# Patient Record
Sex: Female | Born: 1935 | Race: White | Hispanic: No | Smoking: Never smoker
Health system: Southern US, Community
[De-identification: ages and names within clinical notes are randomized; demographics above are authoritative.]

## PROBLEM LIST (undated history)

## (undated) DIAGNOSIS — E785 Hyperlipidemia, unspecified: Secondary | ICD-10-CM

## (undated) DIAGNOSIS — I350 Nonrheumatic aortic (valve) stenosis: Secondary | ICD-10-CM

## (undated) DIAGNOSIS — I2510 Atherosclerotic heart disease of native coronary artery without angina pectoris: Secondary | ICD-10-CM

## (undated) DIAGNOSIS — R001 Bradycardia, unspecified: Secondary | ICD-10-CM

## (undated) DIAGNOSIS — H409 Unspecified glaucoma: Secondary | ICD-10-CM

## (undated) DIAGNOSIS — I10 Essential (primary) hypertension: Secondary | ICD-10-CM

## (undated) HISTORY — DX: Nonrheumatic aortic (valve) stenosis: I35.0

## (undated) HISTORY — DX: Hyperlipidemia, unspecified: E78.5

## (undated) HISTORY — PX: CHOLECYSTECTOMY: SHX55

## (undated) HISTORY — DX: Unspecified glaucoma: H40.9

## (undated) HISTORY — DX: Bradycardia, unspecified: R00.1

## (undated) HISTORY — DX: Atherosclerotic heart disease of native coronary artery without angina pectoris: I25.10

## (undated) HISTORY — PX: ABDOMINAL HYSTERECTOMY: SHX81

## (undated) HISTORY — DX: Essential (primary) hypertension: I10

---

## 1993-03-21 HISTORY — PX: PACEMAKER INSERTION: SHX728

## 1999-08-10 ENCOUNTER — Encounter (INDEPENDENT_AMBULATORY_CARE_PROVIDER_SITE_OTHER): Payer: Self-pay | Admitting: Specialist

## 1999-08-10 ENCOUNTER — Encounter: Payer: Self-pay | Admitting: *Deleted

## 1999-08-10 ENCOUNTER — Encounter: Admission: RE | Admit: 1999-08-10 | Discharge: 1999-08-10 | Payer: Self-pay | Admitting: *Deleted

## 1999-08-10 ENCOUNTER — Other Ambulatory Visit: Admission: RE | Admit: 1999-08-10 | Discharge: 1999-08-10 | Payer: Self-pay | Admitting: *Deleted

## 2002-04-15 ENCOUNTER — Inpatient Hospital Stay (HOSPITAL_COMMUNITY): Admission: EM | Admit: 2002-04-15 | Discharge: 2002-04-18 | Payer: Self-pay | Admitting: Cardiology

## 2002-04-15 ENCOUNTER — Encounter: Payer: Self-pay | Admitting: Cardiology

## 2002-04-16 ENCOUNTER — Encounter: Payer: Self-pay | Admitting: Cardiology

## 2004-02-16 ENCOUNTER — Ambulatory Visit: Payer: Self-pay | Admitting: Cardiology

## 2004-02-19 ENCOUNTER — Ambulatory Visit: Payer: Self-pay | Admitting: Cardiology

## 2004-04-12 ENCOUNTER — Ambulatory Visit: Payer: Self-pay | Admitting: Cardiology

## 2004-12-08 ENCOUNTER — Ambulatory Visit: Payer: Self-pay | Admitting: Internal Medicine

## 2005-03-25 ENCOUNTER — Ambulatory Visit: Payer: Self-pay | Admitting: Cardiology

## 2005-03-30 ENCOUNTER — Ambulatory Visit: Payer: Self-pay | Admitting: Cardiology

## 2005-12-08 ENCOUNTER — Ambulatory Visit: Payer: Self-pay | Admitting: Cardiology

## 2006-03-31 ENCOUNTER — Ambulatory Visit: Payer: Self-pay | Admitting: Cardiology

## 2006-03-31 ENCOUNTER — Encounter: Payer: Self-pay | Admitting: Physician Assistant

## 2006-04-12 ENCOUNTER — Ambulatory Visit: Payer: Self-pay | Admitting: Cardiology

## 2006-04-12 ENCOUNTER — Encounter: Payer: Self-pay | Admitting: Physician Assistant

## 2006-05-10 ENCOUNTER — Ambulatory Visit: Payer: Self-pay | Admitting: Cardiology

## 2006-05-25 ENCOUNTER — Ambulatory Visit: Payer: Self-pay | Admitting: *Deleted

## 2006-06-22 ENCOUNTER — Ambulatory Visit: Payer: Self-pay | Admitting: *Deleted

## 2006-07-10 ENCOUNTER — Encounter (INDEPENDENT_AMBULATORY_CARE_PROVIDER_SITE_OTHER): Payer: Self-pay | Admitting: Specialist

## 2006-07-10 ENCOUNTER — Ambulatory Visit: Payer: Self-pay | Admitting: *Deleted

## 2006-07-10 ENCOUNTER — Inpatient Hospital Stay (HOSPITAL_COMMUNITY): Admission: RE | Admit: 2006-07-10 | Discharge: 2006-07-11 | Payer: Self-pay | Admitting: *Deleted

## 2006-08-03 ENCOUNTER — Ambulatory Visit: Payer: Self-pay | Admitting: *Deleted

## 2006-12-14 ENCOUNTER — Ambulatory Visit: Payer: Self-pay | Admitting: Cardiology

## 2007-02-01 ENCOUNTER — Ambulatory Visit: Payer: Self-pay | Admitting: *Deleted

## 2007-04-05 ENCOUNTER — Encounter (HOSPITAL_COMMUNITY): Admission: RE | Admit: 2007-04-05 | Discharge: 2007-05-05 | Payer: Self-pay | Admitting: Endocrinology

## 2007-04-05 ENCOUNTER — Encounter: Payer: Self-pay | Admitting: Endocrinology

## 2007-05-08 ENCOUNTER — Ambulatory Visit: Payer: Self-pay | Admitting: Cardiology

## 2007-05-08 ENCOUNTER — Encounter: Payer: Self-pay | Admitting: Physician Assistant

## 2007-05-11 ENCOUNTER — Encounter: Payer: Self-pay | Admitting: Cardiology

## 2007-05-11 ENCOUNTER — Ambulatory Visit: Payer: Self-pay | Admitting: Cardiology

## 2007-12-14 ENCOUNTER — Ambulatory Visit: Payer: Self-pay | Admitting: Internal Medicine

## 2008-01-01 ENCOUNTER — Ambulatory Visit: Payer: Self-pay | Admitting: Cardiology

## 2008-01-17 ENCOUNTER — Ambulatory Visit: Payer: Self-pay | Admitting: *Deleted

## 2008-05-13 ENCOUNTER — Encounter: Payer: Self-pay | Admitting: Endocrinology

## 2008-06-26 ENCOUNTER — Encounter: Payer: Self-pay | Admitting: Internal Medicine

## 2008-07-29 DIAGNOSIS — I350 Nonrheumatic aortic (valve) stenosis: Secondary | ICD-10-CM

## 2008-07-29 DIAGNOSIS — I2510 Atherosclerotic heart disease of native coronary artery without angina pectoris: Secondary | ICD-10-CM | POA: Insufficient documentation

## 2008-08-14 ENCOUNTER — Ambulatory Visit: Payer: Self-pay | Admitting: Internal Medicine

## 2008-08-19 ENCOUNTER — Telehealth: Payer: Self-pay | Admitting: Internal Medicine

## 2008-08-19 ENCOUNTER — Encounter: Payer: Self-pay | Admitting: Internal Medicine

## 2008-08-19 DIAGNOSIS — I495 Sick sinus syndrome: Secondary | ICD-10-CM

## 2008-08-25 ENCOUNTER — Telehealth: Payer: Self-pay | Admitting: Internal Medicine

## 2008-08-28 ENCOUNTER — Telehealth: Payer: Self-pay | Admitting: Internal Medicine

## 2008-09-01 ENCOUNTER — Ambulatory Visit (HOSPITAL_COMMUNITY): Admission: RE | Admit: 2008-09-01 | Discharge: 2008-09-01 | Payer: Self-pay | Admitting: Internal Medicine

## 2008-09-01 ENCOUNTER — Ambulatory Visit: Payer: Self-pay | Admitting: Internal Medicine

## 2008-09-08 ENCOUNTER — Encounter: Payer: Self-pay | Admitting: Internal Medicine

## 2008-09-12 ENCOUNTER — Ambulatory Visit: Payer: Self-pay | Admitting: Internal Medicine

## 2008-10-15 ENCOUNTER — Encounter: Payer: Self-pay | Admitting: Internal Medicine

## 2008-10-24 ENCOUNTER — Telehealth (INDEPENDENT_AMBULATORY_CARE_PROVIDER_SITE_OTHER): Payer: Self-pay | Admitting: *Deleted

## 2008-11-05 ENCOUNTER — Encounter: Payer: Self-pay | Admitting: Internal Medicine

## 2008-11-05 ENCOUNTER — Telehealth: Payer: Self-pay | Admitting: Internal Medicine

## 2008-11-17 ENCOUNTER — Telehealth: Payer: Self-pay | Admitting: Internal Medicine

## 2008-12-19 ENCOUNTER — Ambulatory Visit: Payer: Self-pay | Admitting: Endocrinology

## 2008-12-19 ENCOUNTER — Encounter (INDEPENDENT_AMBULATORY_CARE_PROVIDER_SITE_OTHER): Payer: Self-pay | Admitting: *Deleted

## 2008-12-19 DIAGNOSIS — E210 Primary hyperparathyroidism: Secondary | ICD-10-CM

## 2008-12-19 DIAGNOSIS — E042 Nontoxic multinodular goiter: Secondary | ICD-10-CM | POA: Insufficient documentation

## 2008-12-19 LAB — CONVERTED CEMR LAB: TSH: 1.37 microintl units/mL (ref 0.35–5.50)

## 2008-12-20 ENCOUNTER — Encounter: Payer: Self-pay | Admitting: Endocrinology

## 2008-12-23 LAB — CONVERTED CEMR LAB
Calcium, Total (PTH): 11.5 mg/dL — ABNORMAL HIGH (ref 8.4–10.5)
PTH: 263.2 pg/mL — ABNORMAL HIGH (ref 14.0–72.0)

## 2008-12-24 ENCOUNTER — Encounter: Payer: Self-pay | Admitting: Endocrinology

## 2008-12-29 ENCOUNTER — Ambulatory Visit: Payer: Self-pay | Admitting: Endocrinology

## 2008-12-29 DIAGNOSIS — M810 Age-related osteoporosis without current pathological fracture: Secondary | ICD-10-CM | POA: Insufficient documentation

## 2009-01-01 LAB — CONVERTED CEMR LAB
Calcium, Total (PTH): 11.9 mg/dL — ABNORMAL HIGH (ref 8.4–10.5)
PTH: 205 pg/mL — ABNORMAL HIGH (ref 14.0–72.0)

## 2009-01-02 ENCOUNTER — Telehealth (INDEPENDENT_AMBULATORY_CARE_PROVIDER_SITE_OTHER): Payer: Self-pay | Admitting: *Deleted

## 2009-03-12 ENCOUNTER — Telehealth (INDEPENDENT_AMBULATORY_CARE_PROVIDER_SITE_OTHER): Payer: Self-pay | Admitting: *Deleted

## 2009-05-01 ENCOUNTER — Ambulatory Visit: Payer: Self-pay | Admitting: Internal Medicine

## 2009-08-10 ENCOUNTER — Ambulatory Visit: Payer: Self-pay | Admitting: Endocrinology

## 2009-08-10 LAB — CONVERTED CEMR LAB
Calcium, Total (PTH): 11.2 mg/dL — ABNORMAL HIGH (ref 8.4–10.5)
TSH: 1.75 microintl units/mL (ref 0.35–5.50)

## 2009-08-13 ENCOUNTER — Encounter: Payer: Self-pay | Admitting: Endocrinology

## 2009-10-01 ENCOUNTER — Ambulatory Visit: Payer: Self-pay | Admitting: Endocrinology

## 2009-10-07 ENCOUNTER — Encounter: Payer: Self-pay | Admitting: Endocrinology

## 2009-11-20 ENCOUNTER — Ambulatory Visit: Payer: Self-pay | Admitting: Internal Medicine

## 2010-04-11 ENCOUNTER — Encounter: Payer: Self-pay | Admitting: Endocrinology

## 2010-04-20 NOTE — Cardiovascular Report (Signed)
Summary: Cardiac Catheterization  Cardiac Catheterization   Imported By: Dorise Hiss 11/17/2009 09:12:52  _____________________________________________________________________  External Attachment:    Type:   Image     Comment:   External Document

## 2010-04-20 NOTE — Cardiovascular Report (Signed)
Summary: Office Visit   Office Visit   Imported By: Roderic Ovens 12/01/2009 12:26:02  _____________________________________________________________________  External Attachment:    Type:   Image     Comment:   External Document

## 2010-04-20 NOTE — Cardiovascular Report (Signed)
Summary: Office Visit   Office Visit   Imported By: Roderic Ovens 05/12/2009 11:41:07  _____________________________________________________________________  External Attachment:    Type:   Image     Comment:   External Document

## 2010-04-20 NOTE — Procedures (Signed)
Summary: new pacer maker *st jude*   Current Medications (verified): 1)  Vitamin D (Ergocalciferol) 50000 Unit Caps (Ergocalciferol) .Marland Kitchen.. 1 Q Week 2)  Dorzolamide Hcl 2 % Soln (Dorzolamide Hcl) 3)  Pilopine Hs 4 % Gel (Pilocarpine Hcl) 4)  Travatan Z 0.004 % Soln (Travoprost) 5)  Simvastatin 80 Mg Tabs (Simvastatin) 6)  Metoprolol Succinate 25 Mg Xr24h-Tab (Metoprolol Succinate) 7)  Altace 2.5 Mg Caps (Ramipril) 8)  Cycloben Zaprine Hcl 10 Mg Tab 9)  Citonin Salmon 200 Iu  Allergies (verified): No Known Drug Allergies  PPM Specifications Following MD:  Sherryl Manges, MD     PPM Vendor:  St Jude     PPM Model Number:  [id number]     PPM Serial Number:  [id number] DOI:  02/09/1994     PPM Implanting MD:  Sherryl Manges, MD  Lead 1    Location: RV     DOI: 02/09/1994     Model #: [id number]     Serial #:     Status: active  Magnet Response Rate:  BOL 100 ERI 85  Indications:  sinus arrest  Explantation Comments:  09/01/2008 Nova 282 explanted  PPM Follow Up Remote Check?  No Battery Voltage:  3.01 V     Battery Est. Longevity:  13 YEARS     Pacer Dependent:  No     Right Ventricle  Amplitude: 12 mV, Impedance: 580 ohms, Threshold: 0.75 V at 0.5 msec  Episodes Ventricular Pacing:  2.6%  Parameters Mode:  VVI     Lower Rate Limit:  50     Next Cardiology Appt Due:  10/19/2009 Tech Comments:  Pacer check today.  53 noise reversion episodes.  All short (<30 seconds).  Patient's lead was placed in 1995, unipolar, no option for bipolar sensing.  Pt not pacer dependent.  Noise reversion mode is VOO.  Plan ROV with Dr Johney Frame in 6 months. Gypsy Balsam RN BSN  May 01, 2009 9:44 AM   Appended Document: new pacer maker *st jude* Noise reversion noted upon prior interrogation by Dr Graciela Husbands.   We will follow.

## 2010-04-20 NOTE — Procedures (Signed)
Summary: pacemakerck/rm   Current Medications (verified): 1)  Dorzolamide Hcl 2 % Soln (Dorzolamide Hcl) 2)  Pilopine Hs 4 % Gel (Pilocarpine Hcl) 3)  Travatan Z 0.004 % Soln (Travoprost) 4)  Simvastatin 80 Mg Tabs (Simvastatin) .... Take 1/2 Tablet By Mouth Once Daily 5)  Metoprolol Succinate 25 Mg Xr24h-Tab (Metoprolol Succinate) .... Take 1/2 Tablet By Mouth Once Daily 6)  Altace 2.5 Mg Caps (Ramipril) .... Take 1 Capsule By Mouth Once Daily 7)  Cyclobenzaprine Hcl 10 Mg Tabs (Cyclobenzaprine Hcl) .Marland Kitchen.. 1 Tab Three Times A Day As Needed For Muscle Spasms  Allergies (verified): No Known Drug Allergies  PPM Specifications Following MD:  Sherryl Manges, MD     PPM Vendor:  St Jude     PPM Model Number:  [id number]     PPM Serial Number:  [id number] PPM DOI:  02/09/1994     PPM Implanting MD:  Sherryl Manges, MD  Lead 1    Location: RV     DOI: 02/09/1994     Model #: 431-07     Serial #:     Status: active  Magnet Response Rate:  BOL 100 ERI 85  Indications:  sinus arrest  Explantation Comments:  09/01/2008 Nova 282 explanted  PPM Follow Up Battery Voltage:  2.99 V     Battery Est. Longevity:  12.6 yrs     Pacer Dependent:  No     Right Ventricle  Amplitude: 11.7 mV, Impedance: 580 ohms, Threshold: 075 V at 0.4 msec  Episodes MS Episodes:  0     Ventricular Pacing:  1.8%  Parameters Mode:  VVI     Lower Rate Limit:  50     Next Cardiology Appt Due:  05/20/2010 Tech Comments:  50 NOISE REVERSION EPISODES (PREV CHECK WAS NOISE EPISODES AS WELL).  NORMAL DEVICE FUNCTION.  NO CHANGES MADE. ROV IN 6 MTHS W/JA. Vella Kohler  November 20, 2009 11:46 AM MD Comments:  noise detected on ventricular lead.  Lead appears to be an intermedics lead.  Impedance/ R wave and threshold appear stable.  Only 1.8% V paced.  Will follow and consider adjusting sensitivity upon return.

## 2010-04-20 NOTE — Assessment & Plan Note (Signed)
Summary: FU---STC   Vital Signs:  Patient profile:   77 year old female Height:      63 inches (160.02 cm) Weight:      127.50 pounds (57.95 kg) O2 Sat:      97 % on Room air Temp:     98.6 degrees F (37.00 degrees C) oral Pulse rate:   72 / minute BP sitting:   118 / 80  (left arm) Cuff size:   regular  Vitals Entered By: Josph Macho RMA (Aug 10, 2009 11:26 AM)  O2 Flow:  Room air CC: Follow-up visit/ pt states she is no longer taking Vitamin D and not sure about Cycloben Zaprine/ CF   Referring Provider:  daniel  CC:  Follow-up visit/ pt states she is no longer taking Vitamin D and not sure about Cycloben Zaprine/ CF.  History of Present Illness: the status of at least 3 ongoing medical problems is addressed today: osteoporosis:  she is currently on no rx.  no falls. primary hyperparathyroidism:  she reports cramps, especially at the neck. goiter:  pt says she has intermittent solid dysphagia.  Current Medications (verified): 1)  Vitamin D (Ergocalciferol) 50000 Unit Caps (Ergocalciferol) .Marland Kitchen.. 1 Q Week 2)  Dorzolamide Hcl 2 % Soln (Dorzolamide Hcl) 3)  Pilopine Hs 4 % Gel (Pilocarpine Hcl) 4)  Travatan Z 0.004 % Soln (Travoprost) 5)  Simvastatin 80 Mg Tabs (Simvastatin) 6)  Metoprolol Succinate 25 Mg Xr24h-Tab (Metoprolol Succinate) 7)  Altace 2.5 Mg Caps (Ramipril) 8)  Cycloben Zaprine Hcl 10 Mg Tab 9)  Citonin Salmon 200 Iu  Allergies (verified): No Known Drug Allergies  Past History:  Past Medical History: Last updated: 07/25/2008 Intermedics Nova III pacemaker, Model [id number], implanted on February 09, 1994 CAD Hyperlipidemia Hypertension Aortic Insufficiency Aortic Stenosis Biliary pancreatitis 1995 Glaucoma  Review of Systems  The patient denies syncope.         denies cramps.    Physical Exam  General:  normal appearance.   Neck:  i do not apreciate a goiter Msk:  gait is slow but steady Additional Exam:    Parathyroid Hormone  [H]  203.0  pg/mL                 14.0-72.0   Calcium              [H]  11.2 mg/dL FastTSH                   1.75 uIU/mL      Impression & Recommendations:  Problem # 1:  OSTEOPOROSIS (ICD-733.00) prob exac by #1 can be followed for now  Problem # 2:  PRIMARY HYPERPARATHYROIDISM (ICD-252.01) Assessment: Unchanged can be followed for now  Problem # 3:  GOITER, MULTINODULAR (ICD-241.1) Assessment: Unchanged  Problem # 4:  dysphagia not related to #3  Medications Added to Medication List This Visit: 1)  Cyclobenzaprine Hcl 10 Mg Tabs (Cyclobenzaprine hcl) .Marland Kitchen.. 1 tab three times a day as needed for muscle spasms  Other Orders: T-Parathyroid Hormone, Intact w/ Calcium (09811-91478) Radiology Referral (Radiology) Radiology Referral (Radiology) TLB-TSH (Thyroid Stimulating Hormone) (84443-TSH) Est. Patient Level IV (29562)  Patient Instructions: 1)  blood tests are being ordered for you today.  please call [phone] to hear your test results. 2)  also, i have requested a repeat thyroid ultrasound to be done at [facility].  you will be called with a day and time for an appointment.  a few days after this, also  please call [phone] to hear your results. 3)  also, i have requested a repeat osteoporosis x ray.  you will be called with a day and time for an appointment. 4)  it is unlikely that the difficulty swallowing is due to your enlarged thyroid.  you should have this checked as a separate problem.   5)  Please schedule a follow-up appointment in 6 months. 6)  (update: i left message on phone-tree:  rx as we discussed)

## 2010-04-20 NOTE — Assessment & Plan Note (Signed)
Summary: PER DAHLIA SCHED--FU---STC   Vital Signs:  Patient profile:   77 year old female Height:      63 inches (160.02 cm) Weight:      154.25 pounds (70.11 kg) BMI:     27.42 O2 Sat:      96 % on Room air Temp:     98.1 degrees F (36.72 degrees C) oral Pulse rate:   74 / minute BP sitting:   138 / 80  (left arm) Cuff size:   regular  Vitals Entered By: Brenton Grills MA (October 01, 2009 1:08 PM)  O2 Flow:  Room air CC: F/U appt/aj   Referring Provider:  daniel  CC:  F/U appt/aj.  History of Present Illness: (pt asks about thyroid ultrasound scheduling)--no visit  Current Medications (verified): 1)  Dorzolamide Hcl 2 % Soln (Dorzolamide Hcl) 2)  Pilopine Hs 4 % Gel (Pilocarpine Hcl) 3)  Travatan Z 0.004 % Soln (Travoprost) 4)  Simvastatin 80 Mg Tabs (Simvastatin) 5)  Metoprolol Succinate 25 Mg Xr24h-Tab (Metoprolol Succinate) 6)  Altace 2.5 Mg Caps (Ramipril) 7)  Cyclobenzaprine Hcl 10 Mg Tabs (Cyclobenzaprine Hcl) .Marland Kitchen.. 1 Tab Three Times A Day As Needed For Muscle Spasms  Allergies (verified): No Known Drug Allergies   Other Orders: No Charge Patient Arrived (NCPA0) (NCPA0)

## 2010-05-26 ENCOUNTER — Other Ambulatory Visit (INDEPENDENT_AMBULATORY_CARE_PROVIDER_SITE_OTHER): Payer: Medicare Other

## 2010-05-26 ENCOUNTER — Encounter (INDEPENDENT_AMBULATORY_CARE_PROVIDER_SITE_OTHER): Payer: Medicare Other | Admitting: Vascular Surgery

## 2010-05-26 DIAGNOSIS — I6529 Occlusion and stenosis of unspecified carotid artery: Secondary | ICD-10-CM

## 2010-05-27 ENCOUNTER — Encounter (INDEPENDENT_AMBULATORY_CARE_PROVIDER_SITE_OTHER): Payer: Self-pay | Admitting: *Deleted

## 2010-05-27 NOTE — Assessment & Plan Note (Signed)
OFFICE VISIT  Jodi, Dyer DOB:  1924-04-02                                       05/26/2010 BJYNW#:[id number]  I saw Jodi Dyer in the office today for follow-up of her carotid disease. This was a prior patient of Dr. [name].  She had undergone a right carotid endarterectomy in April 2080.  She has been routine carotid duplex scan in our office but has not been seen by [place] since May of 2008. She denies any history of stroke, TIAs, expressive or receptive aphasia, or amaurosis fugax.  SOCIAL HISTORY:  She has 5 children.  She is widowed.  She is retired. She does not smoke cigarettes.  REVIEW OF SYSTEMS:  CARDIOVASCULAR:  She had no chest pain, chest pressure, palpitations or arrhythmias.  She had no claudication, rest pain or nonhealing ulcers.  She has had no history of DVT or phlebitis. PULMONARY:  She had no productive cough, bronchitis, asthma or wheezing.  PHYSICAL EXAMINATION:  This is a pleasant 78 year old woman who appears her stated age.  Blood pressure 146/73, heart rate is 69, saturation 96%.  Lungs:  Clear  bilaterally to auscultation.  Cardiovascular exam: I do not detect any carotid bruits.  She has a regular rate and rhythm with a systolic murmur.  She has palpable femoral pulses with well- perfused feet.  No ischemic ulcers.  She has no significant extremity swelling.  Abdomen:  Soft, nontender with normal pitched bowel sounds. Neurologic exam:  She has no focal weakness or paresthesias.  I independently interpreted the carotid duplex scan which shows a widely patent right carotid endarterectomy site without evidence of restenosis and no significant stenosis on the left side.  Both vertebral arteries are patent with normally directed flow.  I have ordered a follow-up duplex scan in 1 year.  She will be followed on our protocol.  Will see her back if she develops any new neurologic symptoms or her duplex shows any progression of any  significant carotid disease.    Di Kindle. Edilia Bo, M.D. Electronically Signed  CSD/MEDQ  D:  05/26/2010  T:  05/27/2010  Job:  [id number]

## 2010-06-01 NOTE — Letter (Signed)
Summary: Device-Delinquent Check  [facility], Main Office  [address]   [place], [place] [address]   Phone: [phone]  Fax: [phone]     May 27, 2010 MRN: [id number]   Jodi Dyer [address] [place], [place]  [address]   Dear [name],  According to our records, you have not had your implanted device checked in the recommended period of time.  We are unable to determine appropriate device function without checking your device on a regular basis.  Please call our office to schedule an appointment as soon as possible.  If you are having your device checked by another physician, please call us so that we may update our records.  Thank you,  Letta Moynahan, EMT  May 27, 2010 3:44 PM  [facility]

## 2010-06-02 NOTE — Procedures (Unsigned)
CAROTID DUPLEX EXAM  INDICATION:  Follow up right CEA/left carotid disease.  HISTORY: Diabetes:  No. Cardiac:  Yes. Hypertension:  Yes. Smoking:  No. Previous Surgery:  Right CEA, 07/10/2006. CV History:  No. Amaurosis Fugax No, Paresthesias No, Hemiparesis No.                                      RIGHT             LEFT Brachial systolic pressure:         152               140 Brachial Doppler waveforms:         WNL               WNL Vertebral direction of flow:        Antegrade         Antegrade DUPLEX VELOCITIES (cm/sec) CCA peak systolic                   89                92 ECA peak systolic                   139               91 ICA peak systolic                   95                51 ICA end diastolic                   10                15 PLAQUE MORPHOLOGY:                  Heterogenous      Heterogenous PLAQUE AMOUNT:                      Mild              Mild PLAQUE LOCATION:                    ICA               ICA  IMPRESSION: 1. Widely patent right carotid endarterectomy without evidence of     restenosis or hyperplasia. 2. 1% to 39% left internal carotid artery plaquing. 3. Antegrade vertebral arteries bilaterally. 4. No significant change since prior examination of 01/17/2008.  ___________________________________________ Di Kindle. Edilia Bo, M.D.  LT/MEDQ  D:  05/26/2010  T:  05/26/2010  Job:  [id number]

## 2010-06-28 LAB — BASIC METABOLIC PANEL
CO2: 26 mEq/L (ref 19–32)
Chloride: 112 mEq/L (ref 96–112)
GFR calc Af Amer: >60 mL/min (ref >60)
Potassium: 4 mEq/L (ref 3.5–5.1)
Sodium: 143 mEq/L (ref 135–145)

## 2010-06-28 LAB — CBC
HCT: 42.3 % (ref 36.0–46.0)
Hemoglobin: 14.7 g/dL (ref 12.0–15.0)
MCHC: 34.7 g/dL (ref 30.0–36.0)
MCV: 91.1 fL (ref 78.0–100.0)
RBC: 4.64 MIL/uL (ref 3.87–5.11)

## 2010-06-28 LAB — PROTIME-INR: INR: 1 (ref 0.00–1.49)

## 2010-07-23 ENCOUNTER — Encounter: Payer: Self-pay | Admitting: Cardiology

## 2010-07-23 ENCOUNTER — Ambulatory Visit (INDEPENDENT_AMBULATORY_CARE_PROVIDER_SITE_OTHER): Payer: Medicare Other | Admitting: Internal Medicine

## 2010-07-23 ENCOUNTER — Encounter: Payer: Self-pay | Admitting: Internal Medicine

## 2010-07-23 DIAGNOSIS — I2510 Atherosclerotic heart disease of native coronary artery without angina pectoris: Secondary | ICD-10-CM

## 2010-07-23 DIAGNOSIS — I4891 Unspecified atrial fibrillation: Secondary | ICD-10-CM

## 2010-07-23 DIAGNOSIS — I498 Other specified cardiac arrhythmias: Secondary | ICD-10-CM

## 2010-07-23 NOTE — Assessment & Plan Note (Signed)
Normal pacemaker function, though she continues to have noise on her ventricular lead.  As the impedance and threshold are stable, we will continue to monitor this.  We could adjust sensitivity in the future if necessary. See Arita Miss Art report No changes today

## 2010-07-23 NOTE — Progress Notes (Signed)
The patient presents today for routine electrophysiology followup.  Since last being seen in our clinic, the patient reports doing very well.  Today, she denies symptoms of palpitations, chest pain, shortness of breath, orthopnea, PND, lower extremity edema, dizziness, presyncope, syncope, or neurologic sequela.  The patient feels that she is tolerating medications without difficulties and is otherwise without complaint today.   Past Medical History  Diagnosis Date  . Aortic valve stenosis   . CAD (coronary artery disease)   . Hypertension   . Unspecified glaucoma   . Bradycardia     s/p PPM  . Hyperlipidemia    Past Surgical History  Procedure Date  . Pacemaker insertion 1995    single-chamber pacemaker placement  . Abdominal hysterectomy   . Cholecystectomy     Current Outpatient Prescriptions  Medication Sig Dispense Refill  . aspirin 325 MG tablet Take 325 mg by mouth daily.        . dorzolamide (TRUSOPT) 2 % ophthalmic solution Place 1 drop into both eyes BID times 48H.      . metoprolol tartrate (LOPRESSOR) 25 MG tablet Take 0.5 tablets by mouth 2 (two) times daily.       Marland Kitchen PILOPINE HS 4 % ophthalmic gel Place 1 drop into the left eye At bedtime.      . ramipril (ALTACE) 2.5 MG capsule Take 1 tablet by mouth daily.      . simvastatin (ZOCOR) 80 MG tablet Take 40 mg by mouth daily.       . TRAVATAN Z 0.004 % ophthalmic solution Place 1 drop into both eyes At bedtime.        No Known Allergies  History   Social History  . Marital Status: Widowed    Spouse Name: N/A    Number of Children: N/A  . Years of Education: N/A   Occupational History  . Not on file.   Social History Main Topics  . Smoking status: Never Smoker   . Smokeless tobacco: Not on file  . Alcohol Use: No  . Drug Use: No  . Sexually Active: Not on file   Other Topics Concern  . Not on file   Social History Narrative   Lives in [place]   Physical Exam: Filed Vitals:   07/23/10 1539    BP: 128/68  Pulse: 70  Height: 5\' 4"  (1.626 m)  Weight: 149 lb (67.586 kg)    GEN- The patient is well appearing, alert and oriented x 3 today.   Head- normocephalic, atraumatic Eyes-  Sclera clear, conjunctiva pink Ears- hearing intact Oropharynx- clear Neck- supple, no JVP Lymph- no cervical lymphadenopathy Lungs- Clear to ausculation bilaterally, normal work of breathing Chest- pacemaker pocket is well healed Heart- Regular rate and rhythm, 2/6 SEM LUSB (early peaking) GI- soft, NT, ND, + BS Extremities- no clubbing, cyanosis, or edema MS- no significant deformity or atrophy Skin- no rash or lesion Psych- euthymic mood, full affect Neuro- strength and sensation are intact  Pacemaker interrogation- reviewed in detail today,  See PACEART report  Assessment and Plan:

## 2010-07-23 NOTE — Assessment & Plan Note (Signed)
No ischemic symptoms No changes today 

## 2010-08-03 NOTE — Cardiovascular Report (Signed)
[facility]                   [facility] NOTE   Jodi Dyer, Jodi Dyer                       MRN:          [id number]  DATE:09/12/2008                            DOB:          [birth year]    Jodi Dyer is seen following pacemaker generator replacement.  She has a  unipolar lead in.  Noise is noted on her device at this time.   Her medications are not changed.   Her blood pressure today was 138/72, her pulse was 58.  Her lungs were  clear.  Her heart sounds were regular.  Her pacemaker site was well-  healed.  The extremities were without edema.   Interrogation of her St. Jude pulse generator demonstrates an R-wave of  9.7 with impedance of 550, a threshold of 0.7 and 0.5.  She is  ventricular paced 14% of the time.  She has a couple of episodes on the  device of noise reversion.  We will leave the device programmed as it  is.  It is not clear whether this noise reversion is new, since the  device was implanted and there may be a lead fracture or whether this is  something that has been present for a long time and simply the old  device was not able to show it.   We will keep an eye on this via the interrogation.  These episodes as  noted are really quite short the ones that we have seen are less than 2  seconds in duration.     Duke Salvia, MD, [facility]  Electronically Signed    SCK/MedQ  DD: 09/12/2008  DT: 09/13/2008  Job #: [id number]

## 2010-08-03 NOTE — Assessment & Plan Note (Signed)
[facility]                          [facility] NOTE   BRITTANI, PURDUM                       MRN:          [id number]  DATE:01/01/2008                            DOB:          1924/12/17    PRIMARY CARE Tita Terhaar:  Roma Kayser at [facility].   REASON FOR VISIT:  Cardiac followup.   HISTORY OF PRESENT ILLNESS:  I saw Ms. Mccauslin back in February.  She  reports doing well.  She is not having any significant angina and has  been compliant with her medications.  She is independent in her  activities of daily living and continues to drive.  Device followup was  via transtelephonic monitoring.  She had a followup echocardiogram done  back in February 2009 demonstrating left ventricular ejection fraction  of 55-60%.  Pacemaker was visualized within the right atrium, and there  was mild tricuspid regurgitation.  Aortic valve area was in the mildly  stenotic range, and she also had mild aortic regurgitation.  She is  reporting stable NYHA class II dyspnea on exertion.  No palpitations and  no syncope.   ALLERGIES:  No known drug allergies.   PRESENT MEDICATIONS:  1. Travatan eye drops.  2. Metoprolol 25 mg one-half tablet p.o. daily.  3. Azopt eye drops.  4. Ramipril 2.5 mg p.o. daily.  5. Multivitamin.  6. Aspirin 325 mg p.o. daily.  7. Red yeast rice extract.  8. Lecithin.  9. Fortical 1 spray daily.  10.Zocor 40 mg p.o. nightly.  11.She has sublingual nitroglycerin, but has not had to use these.   REVIEW OF SYSTEMS:  As per history of present illness.  She reports  problems with her parathyroid gland.  She is now off calcium  supplements.  No other details available.  Otherwise, negative.   PHYSICAL EXAMINATION:  VITAL SIGNS:  Blood pressure 147/77, heart rate  is 67, weight is 154 pounds which is stable.  GENERAL:  The patient is comfortable in no acute distress.  HEENT:  Conjunctiva is normal.  Oropharynx  clear.  NECK:  Supple.  No elevated jugular venous pressure.  No loud carotid  bruits.  LUNGS:  Clear without labored breathing at rest.  CARDIAC:  Regular rate and rhythm, 3/6 systolic murmur at the base with  normal second heart sound.  No pericardial rub, S3, or gallop.  ABDOMEN:  Soft, nontender.  EXTREMITIES:  Exhibit no frank pitting edema.  Distal pulses 1-2+.  SKIN:  Warm and dry.  MUSCULOSKELETAL:  No kyphosis noted.  NEUROPSYCHIATRIC:  The patient is alert and oriented x3.  Affect is  normal.   IMPRESSION AND RECOMMENDATIONS:  1. Cardiovascular disease status post anterior wall myocardial      infarction in 2004 with subsequent drug-eluting stent placed in the      proximal left anterior descending.  Myoview in 2007 demonstrated no      active ischemia, and Ms. Behe remains clinically stable.  Ejection      fraction is normal by followup echocardiography.  We will plan to      continue medical  therapy and follow up in 6 months.  2. History of heart block status post single-chamber pacemaker in      1995, followed by Dr. Graciela Husbands via transtelephonic monitoring.  3. Mild aortic valve stenosis with mild aortic regurgitation.  4. History hyperlipidemia, on statin therapy.  The patient is due for      followup lipid profile and liver function tests.     [name], MD  Electronically Signed    SGM/MedQ  DD: 01/01/2008  DT: 01/02/2008  Job #: [id number]   cc:   Roma Kayser

## 2010-08-03 NOTE — Procedures (Signed)
CAROTID DUPLEX EXAM   INDICATION:  Followup carotid artery disease   HISTORY:  Diabetes:  No  Cardiac:  MI  Hypertension:  Yes  Smoking:  No  Previous Surgery:  Right CEA with DPA on 07/10/2006 by Dr. [name]  CV History:  No  Amaurosis Fugax No, Paresthesias No, Hemiparesis No                                       RIGHT             LEFT  Brachial systolic pressure:         151               148  Brachial Doppler waveforms:         Triphasic         Triphasic  Vertebral direction of flow:        Antegrade         Antegrade  DUPLEX VELOCITIES (cm/sec)  CCA peak systolic                   69                64  ECA peak systolic                   72                74  ICA peak systolic                   68                69  ICA end diastolic                   22                13  PLAQUE MORPHOLOGY:                  None              Mixed  PLAQUE AMOUNT:                      None              Mild  PLAQUE LOCATION:                    None              ICA   IMPRESSION:  1. Right ICA shows no evidence of restenosis status post CEA  2. Left ICA shows evidence of 1% to 39% stenosis   ___________________________________________  P. Liliane Bade, M.D.   AS/MEDQ  D:  02/01/2007  T:  02/02/2007  Job:  [id number]

## 2010-08-03 NOTE — Cardiovascular Report (Signed)
[facility]                   [facility] NOTE   MAZELLE, HUEBERT                       MRN:          [id number]  DATE:12/14/2006                            DOB:          May 15, 1924    Ms. Nolde was seen today in the [facility] for followup of her  Intermedics [place] III pacemaker, Model [phone], implanted on February 09, 1994, for sinus arrest.  Interrogation of her device demonstrates R  waves of 7 millivolts with an RV impedance of 759 ohms and a threshold  of 0.5 volts at 0.5 milliseconds.  Her battery voltage was 2.69 volts  and her underlying rhythm was sinus brady.  She is programmed VVI 50.  Diagnostic information is not available on this device.  She does TTMs  with [facility] and will return to clinic in 1 year.      Gypsy Balsam, RN,BSN  Electronically Signed      Duke Salvia, MD, [facility]  Electronically Signed   AS/MedQ  DD: 12/14/2006  DT: 12/14/2006  Job #: [id number]

## 2010-08-03 NOTE — Discharge Summary (Signed)
NAMELAVENDER, Jodi Dyer NO.:  [id number]   MEDICAL RECORD NO.:  [id number]          PATIENT TYPE:  OIB   LOCATION:  2899                         FACILITY:  [facility]   PHYSICIAN:  Duke Salvia, MD, FACCDATE OF BIRTH:  06/09/1924   DATE OF ADMISSION:  09/01/2008  DATE OF DISCHARGE:  09/01/2008                               DISCHARGE SUMMARY   This patient has no known drug allergies.   Time for this dictation and explanation to the patient is greater than  35 minutes.   FINAL DIAGNOSES:  1. Single chamber pacemaker implanted for heart block 1995, now at      elective replacement indicator.  2. Implant of a St. Jude ACCENT SR single chamber pacemaker implanted,      September 01, 2008, Dr. Graciela Husbands.  Pacemaker setting is VVI.   SECONDARY DIAGNOSES:  1. History of coronary artery disease.      a.     Anterior wall myocardial infarction, 2004.      b.     Drug-eluting stent placed to a totally occluded LAD,       proximal position, 2004.  2. Myoview study, 2007, no ischemia.  3. Dyslipidemia.  4. Hypertension.  5. Aortic insufficiency/aortic stenosis.  6. History of biliary pancreatitis.  7. 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  8. Glaucoma.  9. Carotid Doppler study in followup, October 2010.  No restenosis of      the right internal carotid artery and minor stenosis in the left      internal carotid artery.  Once again, pacemaker implanted in 1995      for complete heart block.   PROCEDURE:  Explant of existing pacemaker which is at elective  replacement indicator with implant of the St. Jude single chamber  device, Dr. Sherryl Manges.   BRIEF HISTORY:  Ms. Yontz is an 77 year old female.  She has a history of  coronary artery disease and is status post drug-eluting stent implanted  in 2004 for a totally occluded LAD.  She had not been having chest pain  or shortness of breath lately.  She did have in the past  in 1995  pacemaker implanted for complete heart block.  During recent pacemaker  interrogation, this has been shown to be at [facility]  The risks and benefits  of generator change have been discussed with the patient.  She wishes to  follow up with this at the first available opportunity.   HOSPITAL COURSE:  The patient presents electively, September 01, 2008.  She  underwent implant of the new generator without significant complication  by Dr. Sherryl Manges.  There is no pacer pocket hematoma visible.  The  patient is not having undue discomfort at the generator change site.  The patient will discharge the same day as the generator change, September 01, 2008.  She is asked to keep her incision dry for the next 7 days and  to sponge bathe until Monday, September 08, 2008.  She is to take the bulky  bandage off in  the morning, Tuesday, September 02, 2008, and to leave the  incision open to the air.  She has to follow up in [facility],  [facility] the [facility], Friday, September 12, 2008, at 1:30.   MEDICATIONS AT DISCHARGE:  1. Metoprolol 25 mg one-half tab daily.  2. Ramipril 2.5 mg daily.  3. Multivitamin daily.  4. Enteric-coated aspirin 325 mg daily.  5. Travatan ophthalmic solution and Azopt ophthalmic solution for      glaucoma to apply as has been doing before this admission.   Once again, she follows up at [facility] of the [facility],  [facility], Friday, September 12, 2008, at 1:30.   LABORATORY STUDIES:  Taken in this admission, September 01, 2008, protime is  13.5, INR is 1.0, PTT is 30.  Basic metabolic panel, sodium 143,  potassium is 4, chloride 112, carbonate 26, glucose 112, BUN is 12, and  creatinine is 0.79.  Complete blood count, white cells are 4.1,  hemoglobin 14.7, hematocrit 42.3, and platelets are 150.      Maple Mirza, [place]      Duke Salvia, MD, [facility]  Electronically Signed    GM/MEDQ  D:  09/01/2008  T:  09/02/2008  Job:  [id number]   cc:   [name], MD

## 2010-08-03 NOTE — Assessment & Plan Note (Signed)
[facility]                          [facility] OFF[facility] NOTE   Jodi Dyer, Jodi Dyer                       MRN:          [id number]  DATE:05/08/2007                            DOB:          1924-04-08    PRIMARY CARE:  [facility].   REASON FOR VISIT:  Routine cardiac follow-up.   HISTORY OF PRESENT ILLNESS:  I saw Jodi Dyer back in February of last  year.  She overall is doing well without any significant angina or  progressive breathlessness beyond NYHA class II.  She underwent a  successful a right carotid endarterectomy last year by Dr. [name] and has  done well since that time.  She has monthly transtelephonic pacemaker  checks.  Her electrocardiogram today shows sinus rhythm with left atrial  enlargement and left ventricle hypertrophy with nonspecific ST changes.  There is no major change compared to her prior tracing from January of  last year.  I reviewed her medications today.   ALLERGIES:  No known drug allergies.   MEDICATIONS:  Travatan eyedrops, metoprolol 25 mg 1/2 tablet p.o. b.i.d.  Azopt eyedrops, simvastatin 40 mg p.o. nightly, ramipril 2.5 mg p.o.  daily, vitamin D supplements.  Aspirin 325 mg p.o. daily.  Red yeast  rice extract.  Nitroglycerin 0.4 mg p.r.n. (has not needed).   REVIEW OF SYSTEMS:  As described in the history of present illness.  No  palpitations or syncope.  She has had some falls, although not  associated with any loss of consciousness or dizziness.  These have been  with loss of balance.  She denies having any major injuries, however.  Otherwise systems negative.   EXAMINATION:  Blood pressure is 152/78, heart rate 75, weight 151.8  pounds.  The patient is comfortable and in no acute distress.  HEENT:  Conjunctiva, lids normal.  Pharynx clear.  Neck is supple.  No elevated is pressure no loud bruits.  No thyromegaly  or thyroid tenderness is noted.  Lungs are clear without labored breathing  at rest.  Cardiac exam reveals a regular rate and rhythm with a 3/6 systolic  murmur at the right base.  Second heart sound is preserved.  There is no  pericardial rub or S3 gallop.  ABDOMEN:  Soft, nontender, normoactive bowel sounds.  EXTREMITIES:  Exhibit no pitting edema.  Distal pulses are 2+.  SKIN:  Warm and dry.  MUSCULOSKELETAL:  No kyphosis noted.  NEUROPSYCHIATRIC:  The patient is alert x3.  Affect is appropriate.   IMPRESSION/RECOMMENDATIONS:  1. Coronary artery disease status post previous anterior wall      myocardial infarction in 2004 with subsequent drug-eluting stent      placement of proximal left anterior descending.  Follow-up Myoview      last year indicated no frank ischemia with an ejection fraction of      70%.  She is stable symptomatically on medical therapy.  I will      plan to see her back over the next 6 months.  2. History of heart block status post single chamber pacemaker  placement in 1995.  She is followed by Dr. Graciela Husbands and at this at      this point is undergoing transtelephonic evaluation of her device.  3. History of mild aortic stenosis.  Last echocardiogram was in 2007.      We will plan a follow-up study.  4. Hyperlipidemia, on statin therapy.  Suggest LDL control around 70.     [name], MD  Electronically Signed    SGM/MedQ  DD: 05/08/2007  DT: 05/08/2007  Job #: [id number]

## 2010-08-03 NOTE — Op Note (Signed)
NAMEMARTENA, Jodi Dyer NO.:  [id number]   MEDICAL RECORD NO.:  [id number]          PATIENT TYPE:  OIB   LOCATION:  2899                         FACILITY:  [facility]   PHYSICIAN:  Duke Salvia, MD, FACCDATE OF BIRTH:  Apr 12, 1924   DATE OF PROCEDURE:  09/01/2008  DATE OF DISCHARGE:  09/01/2008                               OPERATIVE REPORT   PREOPERATIVE DIAGNOSIS:  Previously implanted pacemaker for bradycardia  associated with abdominal insufflation (apparently).   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   PROCEDURE:  Explantation of a previously implanted device and  implantation of a new device.   Following obtaining informed consent, the patient was brought to the  Electrophysiology Laboratory and placed on the fluoroscopic table in the  supine position.  After routine prep and drape of the left upper chest,  lidocaine was infiltrated overlying the previous incision and carried  down to the layer of device pocket using sharp dissection and  electrocautery.  The pocket was approached and it was noted that the  lead was on the anterior surface of the can.  Great care was then taken  to get out the pocket without any damage to the leads.  We then having  been able to do this removed the device and then freed up the lead at  least in some degree so was to be able to put a posterior to the can.   The lead was interrogated in an unipolar configuration with its R wave  was 14.7, impedance of 766, threshold 0.9 at 0.5 with a current  threshold of 1.4 mA.  I should note that the lead was [employer] 431-07  lead.   The lead was then attached to a St. Jude Accent pulse generator, serial  number [id number] and the model was PM1110.   The pocket was copiously irrigated with antibiotic containing saline  solution.  I had violated the posterior aspect of the  pocket and ended  up with some bleeding from the pectoralis muscle that was underlying and  I put a hemostatic suture there and then ended up using some Surgicel.  We put the lead and the pulse generator back in the pocket and secured  at the prepectoral fascia, irrigated the pocket copiously with  antibiotic containing saline solution and then  closed the pocket in 2 layers in a normal fashion.  The wound was washed  and dried and a benzoin and Steri-Strip dressing was applied.  Needle  counts, sponge counts, and instrument counts were correct at the end of  the procedure according to the staff.  The patient tolerated the  procedure without apparent complication.      Duke Salvia, MD, [facility]  Electronically Signed     Duke Salvia, MD, [facility]  Electronically Signed    SCK/MEDQ  D:  09/01/2008  T:  09/02/2008  Job:  [id number]

## 2010-08-03 NOTE — Procedures (Signed)
CAROTID DUPLEX EXAM   INDICATION:  Followup carotid artery disease.   HISTORY:  Diabetes:  No.  Cardiac:  MI.  Hypertension:  Yes.  Smoking:  No.  Previous Surgery:  Right CEA with DPA 07/10/2006 by Dr. [name].  CV History:  No.  Amaurosis Fugax No, Paresthesias No, Hemiparesis No                                       RIGHT             LEFT  Brachial systolic pressure:         138               136  Brachial Doppler waveforms:         Biphasic          Biphasic  Vertebral direction of flow:        Antegrade         Antegrade  DUPLEX VELOCITIES (cm/sec)  CCA peak systolic                   55                69  ECA peak systolic                   137               106  ICA peak systolic                   62                67  ICA end diastolic                   18                22  PLAQUE MORPHOLOGY:                  N/A               Mixed  PLAQUE AMOUNT:                      N/A               Mild  PLAQUE LOCATION:                    N/A               ICA/bifurcation   IMPRESSION:  1. Right ICA shows no evidence of restenosis status post CEA.  2. Left ICA shows evidence of 1-39% stenosis.  3. No significant changes from previous study.   ___________________________________________  P. Liliane Bade, M.D.   AS/MEDQ  D:  01/17/2008  T:  01/17/2008  Job:  [id number]

## 2010-08-06 NOTE — Discharge Summary (Signed)
Jodi Dyer, Jodi Dyer                ACCOUNT NO.:  [id number]   MEDICAL RECORD NO.:  [id number]          PATIENT TYPE:  INP   LOCATION:  NA                           FACILITY:  [facility]   PHYSICIAN:  Balinda Quails, M.D.    DATE OF BIRTH:  1924/10/07   DATE OF ADMISSION:  07/10/2006  DATE OF DISCHARGE:                               DISCHARGE SUMMARY   PRIMARY CARE PHYSICIAN:  Dr. Eliberto Ivory.   CARDIOLOGIST:  Dr. Andee Lineman.   CHIEF COMPLAINT:  Right internal carotid artery stenosis, asymptomatic.   HISTORY AND PHYSICAL:  This is a 77 year old Caucasian female with a  past medical history of coronary artery disease.  On the physical exam,  at Dr. Margarita Mail office, she was noted to have a right carotid bruit.  She then underwent a Doppler evaluation, which showed elevated right  internal carotid artery, velocities of 300/88 cm/sec.  No left  intracarotid artery stenosis noted.  Patient was referred to Dr. [name].  She did go under CT angio on June 05, 2006 at [facility], which  verified a high grade proximal right internal artery stenosis, estimated  to be 95%.  This is consistent with the Doppler results on her carotid.  It was best thought that the patient undergo right a carotid  endarterectomy to decrease her risk of stroke.   The patient remains asymptomatic.  She denies headaches, nausea,  vomiting, vertigo, dizziness, falls, numbness, tingling, weakness,  dysphagia, visual changes, syncope, presyncope, motor and sensory  defects, amaurosis fugax  and TIA/CVA symptoms.   PAST MEDICAL HISTORY:  1. Coronary artery disease, status post CYPHER left anterior      descending stent in January of 2004.  2. History of heart block with single chamber pacemaker in 1995.  3. Hypertension.  4. Hyperlipidemia.  5. Myocardial infarction.   PAST SURGICAL HISTORY:  1. Tonsillectomy and adenoidectomy.  2. Appendectomy.  3. Hysterectomy.  4. Removal of cyst from a breast.  5. Bladder  surgery.  6. Cholecystectomy.   MEDICATIONS:  1. Aspirin 325 mg p.o. daily.  2. Zocor 40 mg p.o. q.h.s.  3. Cyclobenzaprine 10 mg p.o. p.r.n.  4. Meclizine 25 mg p.o. daily.  5. Altace 2.5 mg p.o. daily.  6. Toprol-XL 12.5 mg p.o. daily.  7. Nitroglycerin p.r.n.   ALLERGIES:  NO KNOWN DRUG ALLERGIES.   REVIEW OF SYSTEMS:  See HPI for pertinent positives and negatives;  otherwise, negative for chest pain, syncope, shortness of breath, CVA,  diabetes mellitus, renal insufficiency and hypothyroidism.   SOCIAL HISTORY:  Patient is widowed with 5 step-children.  She lives  alone.  She is retired and does not drive.  She denies any alcohol or  tobacco abuse.   FAMILY HISTORY:  Mother deceased at 11 to complications of a stroke.  Mother had a history of diabetes mellitus.  Father deceased at age 22 to  complications of a stroke.  She has 4 siblings who are deceased due to  stroke, myocardial infarction and cancer.  Her children have a history  of diabetes mellitus and coronary artery disease.  PHYSICAL EXAMINATION:  VITAL SIGNS:  Blood pressure 130/73.  Heart rate  72.  Respirations 16.  GENERAL:  This is a 77 year old Caucasian female not in acute distress.  HEENT:  Normocephalic, atraumatic.  Pupils equal, round and reactive to  light and accommodation.  Extraocular movements intact.  Oral mucosa  pink and moist.  Sclerae nonicteric.  NECK:  Supple.  Palpable carotids bilaterally.  Patient does have a  right carotid bruit on exam.  RESPIRATORY:  Symmetrical on inspiration.  Unlabored and clear to  auscultation bilaterally.  CARDIAC:  Regular rate and rhythm.  No murmur, gallop or rub.  ABDOMEN:  Soft, nontender, nondistended.  Normoactive bowel sounds x4.  GU/RECTAL:  Deferred.  EXTREMITIES:  No lower extremity edema.  Lower extremities are warm.  She has 2+ radial and femoral pulses bilaterally.  Absent pulses in  dorsalis pedis and posterior tibial.  NEUROLOGIC:  Nonfocal.   Alert and oriented x4.  Gait is steady.  Strength 5+ bilaterally and throughout.  Deep tendon reflexes 2+ and  symmetrical.   ASSESSMENT:  Moderate to severe right internal carotid artery stenosis,  asymptomatic.   PLAN:  We will admit the patient to [facility] on July 10, 2006 under Dr. [name]' service.  Patient will undergo a right carotid  endarterectomy.  The risks and benefits were explained to the patient in  great detail, Dr. [name] has seen and evaluated the patient prior to  admission and is in agreeance to the above.      [name], PA    ______________________________  P. Liliane Bade, M.D.    JMW/MEDQ  D:  07/06/2006  T:  07/06/2006  Job:  [id number]   cc:   Learta Codding, MD,FACC  Eliberto Ivory, M.D.

## 2010-08-06 NOTE — Cardiovascular Report (Signed)
NAME:  Jodi Dyer, MADL NO.:  [id number]   MEDICAL RECORD NO.:  [id number]                   PATIENT TYPE:  INP   LOCATION:  2922                                 FACILITY:  [facility]   PHYSICIAN:  Charlies Constable, M.D. [facility]              DATE OF BIRTH:  04-Mar-1925   DATE OF PROCEDURE:  04/15/2002  DATE OF DISCHARGE:                              CARDIAC CATHETERIZATION   PROCEDURE PERFORMED:  Cardiac catheterization and percutaneous coronary  intervention.   CLINICAL HISTORY:  The patient is 77 years old and has had no prior history  of known heart disease, but she does have a history of hypertension and she  had a VVI pacemaker implanted for asystole following the surgical procedure.  She developed the onset of chest pain at 2 p.m. on Sunday, January 26 and  presented to [facility] at 11:45 p.m. on January 26 with chest pain.  Her ECG showed a paced rhythm. Her enzymes were positive and she was  transferred here.  We saw her with continued pain and her ECG when she  arrived here was not paced and showed 1 mm ST elevation in V1 to V3 with  biphasic T waves across the precordial leads.  We felt this classified as an  ST elevation infarction and she was taken emergently to the catheterization  lab.  She was still having ongoing chest pain.   DESCRIPTION OF PROCEDURE:  The procedure was performed via the right femoral  artery using an arterial sheath and 6 French preformed coronary catheters.  A front wall arterial puncture was performed and Omnipaque contrast was  used. After completion of the diagnostic study, we made a decision to  proceed with intervention on the LAD.  The patient was given additional  heparin to prolong the ACT to greater than 200 seconds and was started on  Integrilin double bolus and drip.   We used a 7 [place] JL 3.5 guiding catheter with side-holes and a short  floppy wire.  We crossed the lesion in the proximal LAD with the  wire  without difficulty.  This resulted in TIMI-1 flow.  We then used the Export  catheter and were able to estimate a moderate amount of thrombosis.  This  improved the flow to TIMI-3 but there was a high-grade 90% stenosis left in  the vessel.  We pre-dilated with a 2.5 x 20 mm Maverick performing three  inflation up to 8 atmospheres for 30 seconds.  We then deployed a 2.5 x 28  mm Cypher stent deploying this with one inflation of 9 atmospheres for 35  seconds.  We then post-dilated with a 3.5 x 20 mm Quantum Maverick  performing three inflations up to 16 atmospheres for 30 seconds.  We were  careful not to get outside the edges of the stent since it was borderline  oversized for the vessel.  Repeat diagnostic studies  were then performed  through the guiding catheter.  The patient tolerated the procedure well and left the laboratory in  satisfactory condition.   RESULTS:  1. The aortic pressure was 145/79 with a mean of 107.  Left ventricular     pressure 145/23.  2. The left main coronary artery:  The left main coronary artery was free of     significant disease.  3. Left anterior descending:  The left anterior descending artery was     completely occluded after a moderately large diagonal branch and septal     perforator.  There were some collaterals both from the right coronary     artery and from the circumflex marginal vessel.  4. Circumflex artery:  The circumflex artery gave rise to a small     intermediate branch, an atrial branch, a large marginal branch which had     two sub-branches and a posterolateral branch.  There was 30% narrowing in     the proximal circumflex artery.  5. Right coronary artery:  The right coronary was a moderately large vessel     that gave rise to a right ventricular branch, a posterior descending     branch and a posterolateral branch.  There was 30% narrowing in the     proximal right coronary artery.   LEFT VENTRICULOGRAM:  The left  ventriculogram was performed the RAO  projection showed akinesis of the tip of the apex.  The rest of the wall  motion was good and the overall estimated ejection fraction was 55%.   Following the use of the Export catheter, balloon angioplasty and stenting  of the lesion in the proximal, the stenosis improved from 100% to less than  10% and the flow improved from TIMI-0 to TIMI-3 flow.   The patient had the onset of chest pain at 1400 hours on January 25 and  arrived at [facility] Emergency Room at 2345 hours on January 25 at  which time the ECG showed a paced rhythm.  She had continued pain and was  transferred to [place] and her ECG when she arrived here, was not paced and  showed 1 mm ST elevation anteriorly.  Reperfusion was established with the  Export catheter at 1511 hours.  This gave a door-to-balloon time from  [facility] Emergency Room of 15 hours and 26 minutes and a  reperfusion time of 25 hours and 26 minutes.   CONCLUSIONS:  1. Acute anterior wall myocardial infarction with total occlusion in the     proximal left anterior descending, 30% stenosis in the proximal     circumflex artery, 30% stenosis in the proximal right coronary artery and     inferoapical wall akinesis.  2. Successful late reperfusion of the totally occluded left anterior     descending artery with improvement in percent diameter narrowing from     100% to less than 10% and improvement in flow from TIMI-0 to TIMI-3 flow     with the use of a Cypher stent.   DISPOSITION:  The patient was returned to the postangioplasty unit for  further observation.  Her chances for recovery of LV function are good since  she did have collaterals and she had a good blush post intervention.  The  area involved with the infarct does not appear extremely large as well.  Charlies Constable, M.D. [facility]   BB/MEDQ  D:  04/15/2002  T:  04/15/2002  Job:  [id number]   cc:    Doreen Beam  [address]  [place]  [place] [address]  Fax: [phone]   Weyman Pedro, M.D.   [name], M.D. [facility]  [address]  [place]  [place] [address]  Fax: 1   [facility]   Cardiopulmonary Laboratory

## 2010-08-06 NOTE — Op Note (Signed)
NAMEMAR, Jodi Dyer                ACCOUNT NO.:  [id number]   MEDICAL RECORD NO.:  [id number]          PATIENT TYPE:  INP   LOCATION:  3310                         FACILITY:  [facility]   PHYSICIAN:  Balinda Quails, M.D.    DATE OF BIRTH:  1924/04/29   DATE OF PROCEDURE:  07/10/2006  DATE OF DISCHARGE:  07/11/2006                               OPERATIVE REPORT   SURGEON:  Denman George, M.D.   ASSISTANT:  Di Kindle.  Edilia Bo, M.D.  and Charlesetta Garibaldi, [place].   ANESTHETIC:  General endotracheal.   ANESTHESIOLOGIST:  Burna Forts, M.D.   PREOPERATIVE DIAGNOSIS:  Severe asymptomatic right internal carotid  artery stenosis.   POSTOPERATIVE DIAGNOSIS:  Severe asymptomatic right internal carotid  artery stenosis.   PROCEDURE:  Right carotid endarterectomy with Dacron patch angioplasty.   CLINICAL NOTE:  Jodi Dyer is a 77 year old female seen in the office  for evaluation of progressive right internal carotid artery stenosis.  The patient denied symptoms.  No history of CVA.  Evaluation verified a  severe right ICA stenosis.  It was recommended she undergo right carotid  endarterectomy for reduction of stroke risk.  The patient consented for  surgery.  Risks of the operative procedure were explained to the patient  in detail with a major morbidity mortality of 1-2% to include but not  limited MI, CVA, cranial nerve injury and death.  The patient consented  for surgery.   OPERATIVE PROCEDURE:  The patient was brought to the operating room in  stable condition.  Placed in the supine position.  General endotracheal  anesthesia induced.  Foley catheter and arterial line in place.  Right  neck prepped and draped in a sterile fashion.   Curvilinear skin incision made along the upper border of the right  sternomastoid muscle.  Dissection carried down through the subcutaneous  tissue with electrocautery.  Platysma divided.  Deep dissection carried  down to expose the facial vein.   Facial vein ligated with 2-0 silk and  divided.  The carotid bifurcation exposed.  The common carotid artery  mobilized down to the omohyoid muscle and encircled with a vessel loop.  The vagus nerve was reflected posteriorly and preserved.  The origin of  the superior thyroid and external carotid were freed and encircled with  vessel loops.  The internal carotid artery then followed distally up to  the posterior belly of the digastric muscle and encircled with a vessel  loop.  The hypoglossal nerve identified and preserved.   The patient administered 6000 units of heparin intravenously.  Adequate  circulation time permitted.  Carotid vessels controlled with clamps.  Longitudinal arteriotomy made in the distal common carotid artery.  The  arteriotomy extended across the carotid bulb and up into the internal  carotid artery.  There was a large amount of plaque at the origin of the  right internal carotid artery with a severe stenosis.   A shunt was then inserted.  The plaque removed with an endarterectomy  elevator.  The plaque raised down into the common carotid artery were it  was  divided transversely with Potts scissors.  Plaque then raised up  into the bulb of the superior thyroid and external carotid were  endarterectomized using the eversion technique.  The distal internal  carotid artery plaque then feathered out well.  Fragments of plaque  removed with fine forceps.  Site irrigated with heparin saline solution.   A patch angioplasty endarterectomy site was then carried out using a  Finesse Dacron patch and running 6-0 Prolene suture.  At completion of  the patch angioplasty, the shunt was removed.  All vessels well flushed.  Clamps removed initiating antegrade flow up the external carotid artery,  following this the internal carotid was released.  There was an  excellent pulse and Doppler signal in the distal internal carotid  artery.  The patient administered 50 mg protamine  intravenously.  Adequate hemostasis obtained.  Sponges and counts correct.   The sternomastoid fascia was then closed with running 2-0 Vicryl suture.  Platysma closed with running 3-0 Vicryl suture.  Skin closed with 4-0  subcuticular Monocryl.  Sterile dressing applied.  The patient tolerated  the procedure well.  No apparent complications.  Transferred to recovery  room in stable condition.      Balinda Quails, M.D.  Electronically Signed     PGH/MEDQ  D:  10/05/2006  T:  10/06/2006  Job:  [id number]

## 2010-08-06 NOTE — Assessment & Plan Note (Signed)
[facility]                           ELECTROPHYSIOLOGY OFFICE NOTE   Jodi Dyer, Jodi Dyer                       MRN:          [id number]  DATE:12/08/2005                            DOB:          [birth year]    Jodi Dyer is seen in the device clinic in the [place] office today, September  20, for follow up of her [employer] [place] 282 single chamber pacer inserted  in November 1995.  Upon interrogation, battery voltage is 2.72 volts.  She  has not used the pacer at all since being seen in the clinic one year ago.  In the right ventricle, intrinsic amplitude is 7 millivolts with a impedance  of 856 ohms and threshold of 0.5 volts at 0.5 milliseconds.  No programming  changes were made at this time.  She will continue with monthly telephone  texts to [employer] and be seen in the clinic on one years time.                                   Cleatrice Burke, RN                                Duke Salvia, MD, [facility]   CF/MedQ  DD:  12/09/2005  DT:  12/11/2005  Job #:  [id number]

## 2010-08-06 NOTE — Assessment & Plan Note (Signed)
[facility]                          [facility] NOTE   Jodi Dyer                       MRN:          [id number]  DATE:05/10/2006                            DOB:          Jun 24, 1924    REASON FOR VISIT:  Follow up cardiac testing.   HISTORY OF PRESENT ILLNESS:  Ms. Jodi Dyer was in the office back in January  and evaluated by Gene Serpe at that time.  She has a known history of  coronary artery disease, status post previous acute anterior wall  myocardial infarction with placement of a Cypher drug-eluting stent to  the left anterior descending in January 2004.  She, otherwise, had  nonobstructive disease, and by recent followup Myoview, had an ejection  fraction documented at 70% with no clear evidence of ischemia, and a  small anteroseptal fixed defect representing either scar or soft tissue  attenuation.  She was also noted to have a right carotid bruits on exam,  and was referred for carotid Dopplers, which revealed a zero to 39%  stenosis on the left, and 80% to 99% stenosis on the right.  She has a  pending visit with Dr. [name].  Symptomatically, she has stable NYHA  Class 2 dyspnea on exertion, and has not had any significant angina.  I  reviewed her medications today, which are unchanged.   ALLERGIES:  NO KNOWN DRUG ALLERGIES:   PRESENT MEDICATIONS:  1. Aspirin 325 mg p.o. daily.  2. Simvastatin 40 mg p.o. nightly.  3. Cyclobenzaprine 10 mg p.o. p.r.n.  4. Meclizine 25 mg p.o. daily.  5. Altace 2.5 mg p.o. daily.  6. Toprol XL 12.5 mg p.o. daily.  7. She has sublingual nitroglycerin, and has not had to use this.   REVIEW OF SYSTEMS:  As described in history of present illness.  She has  had no focal weakness, headaches, or syncope.  She had a fall back in  December, but states that she tripped.  She has had no visual changes.   EXAMINATION:  Blood pressure today is 142/64.  Weight is 157.6 pounds.  The patient is  comfortable, and in no acute distress.  HEENT:  Conjunctivae was normal.  Oropharynx is clear.  NECK:  Supple.  Soft right carotid bruit.  No thyromegaly.  LUNGS:  Clear without labored breathing.  CARDIAC EXAM:  Reveals a regular rate and rhythm with a 2/6 systolic  murmur heard best at the base.  No S3 or gallop noted.  ABDOMEN:  Soft and non-tender.  Normoactive bowel sounds.  EXTREMITIES:  Show no pitting edema.  Distal pulses are 2+.  SKIN:  Warm and dry.  MUSCULOSKELETAL:  No kyphosis is noted.  NEUROLOGIC/PSYCHIATRIC:  Patient is alert and oriented x3.  Grossly  nonfocal.   IMPRESSION AND RECOMMENDATIONS:  1. Coronary artery disease, status post previous anterior wall      myocardial infarction in January 2004, status post placement of a      drug-eluting stent to the proximal left anterior descending.      Recent Myoview indicates an ejection fraction of 70% with no  active      ischemia.  Plan will be to continue medical therapy.  She is no      longer on Plavix, and has been tolerating aspirin well.  I would      not anticipate any additional cardiac testing prior to potential      carotid endarterectomy.  She does have a pending visit with Dr.      [name], given her recently assessed right internal carotid artery      disease.  We will pan to see her back from a cardiac perspective      over the next 6 months.  2. Known mild aortic stenosis.  Last assessed in January 2007.  This      is asymptomatic at this time, and consistent with her cardiac      murmur.  3. History of single-chamber pacemaker placement in 1995 with prior      documented history of heart block.  The patient is followed by Dr.      Graciela Husbands in the Device Clinic, at this point transtelephonically.  She      has an [employer] single-chamber device.     [name], MD  Electronically Signed    SGM/MedQ  DD: 05/10/2006  DT: 05/10/2006  Job #: [id number]   cc:   Balinda Quails, M.D.

## 2010-08-06 NOTE — Discharge Summary (Signed)
Dyer, Jodi                ACCOUNT NO.:  [id number]   MEDICAL RECORD NO.:  [id number]          PATIENT TYPE:  INP   LOCATION:  3310                         FACILITY:  [facility]   PHYSICIAN:  Balinda Quails, M.D.    DATE OF BIRTH:  Dec 15, 1924   DATE OF ADMISSION:  07/10/2006  DATE OF DISCHARGE:  07/11/2006                               DISCHARGE SUMMARY   PRIMARY ADMITTING DIAGNOSIS:  Asymptomatic right internal carotid  stenosis.   ADDITIONAL/DISCHARGE DIAGNOSES:  1. Right asymptomatic right internal carotid artery stenosis.  2. Coronary artery disease status post stent.  3. History of permanent pacemaker 1995.  4. Hypertension and hyperlipidemia.  5. History of myocardial infarction.   PROCEDURE PERFORMED:  Right carotid endarterectomy with Dacron patch  angioplasty.   HISTORY:  The patient is a 77 year old female who recently was found on  physical exam to have a right carotid bruit.  She underwent a Doppler  evaluation by Dr. Andee Lineman which showed elevated velocities in the right  internal carotid artery with no significant stenosis on the left.  She  was subsequently referred to Dr. Liliane Bade for surgical evaluation.  She had a CT angiogram of the neck performed at [facility] and  this verified a high-grade proximal right ICA stenosis estimated to be  90-95% which was consistent with the results of her Doppler studies.  Because of her significant stenosis, Dr. [name] recommended proceeding  with a carotid endarterectomy at this time electively in order to  decrease her risk of stroke.  He explained the risks, benefits, and  alternatives of the procedure to the patient, and she agreed to proceed  with surgery.   HOSPITAL COURSE:  She was admitted to [facility] on July 10, 2006, and underwent a right carotid endarterectomy with Dacron patch  angioplasty by Dr. [name].  She tolerated the procedure well and was  transferred to the stepdown unit in stable  condition.  Postoperatively,  she has done very well.  Her surgical incision sites are all healing  well.  She has remained completely neurologically intact.  She has been  afebrile and all vital signs have been stable.  She is ambulating the  halls without problem and is tolerating a regular diet.  Her labs on  postoperative day one show hemoglobin 12.6, hematocrit 36.1, white count  12.0, platelets 151,000, sodium 138, potassium 3.9, BUN 11, creatinine  0.66.  It is felt that since she is doing well this morning, she may be  discharged home.   DISCHARGE MEDICATIONS:  Discharge medications are as follows.  1. Aspirin 325 mg daily.  2. Altace 2.5 mg daily.  3. Toprol XL 12.5 mg daily.  4. Meclizine 25 mg daily.  5. Flexeril 10 mg daily.  6. Zocor 40 mg daily.  7. Nitroglycerin p.r.n.  8. Oxycodone, 1-2 every 4 to 6 hours p.r.n. for pain.   DISCHARGE INSTRUCTIONS:  She is asked to refrain from driving, heavy  lifting, or strenuous activity.  She may continue ambulating daily and  using her incentive spirometer.  She may shower daily  and clean her  incisions with soap and water.  She will continue a low-fat, low-sodium  diet.   DISCHARGE FOLLOW-UP:  She will see Dr. [name] back in the office in three  weeks and our office will contact her with an appointment.  In the  interim, she may contact our office if she experiences any problems or  has questions.      Coral Ceo, P.A.      Balinda Quails, M.D.  Electronically Signed    GC/MEDQ  D:  07/11/2006  T:  07/11/2006  Job:  [id number]   cc:   Dannielle Burn, MD

## 2010-08-06 NOTE — Assessment & Plan Note (Signed)
[facility]                          [facility] NOTE   Jodi, Dyer                       MRN:          [id number]  DATE:03/31/2006                            DOB:          1924/12/19    PRIMARY CARDIOLOGIST:  Dr. Simona Huh.   REASON FOR VISIT:  Schedule followup.   Jodi Dyer is a very pleasant 77 year old female with known coronary  artery disease and is status post permanent pacemaker with a history of  asystole, followed by Dr. Sherryl Manges, who now returns to the clinic  for scheduled followup.   Since last seen here 1 year ago, by Arnette Felts, [facility], the patient appears  to be doing quite well from a cardiovascular standpoint. Of note,  however, she lives alone and is a somewhat difficult historian in terms  of providing an accurate history of whether or not she has any symptoms  of exertional angina. She recalls having symptoms that she thought was  her heartburn when in fact she had an MI. Although she denies any  frank exertional chest pain, she does state that she has heartburn and  that these symptoms are not that bad.   Of note, the patient does not appear to go beyond the confines of her  house very much. She describes being transported by Zenaida Niece up to [place] which is apparently approximately a mile away.   The patient did have scheduled interrogation of her pacemaker this past  September, at which time it was noted that she had not used the pacer at  all since being seen in the clinic 1 year earlier. No programming  changes were made, the patient continues to have monthly transtelephonic  checks, and is schedule to followup with Dr. Graciela Husbands in 1 year.   CURRENT MEDICATIONS:  1. Aspirin 325 mg daily.  2. Simvastatin 40 mg nightly.  3. Cyclobenzaprine 10 mg p.r.n.  4. Meclizine 25 mg daily.  5. Altace 2.5 mg daily.  6. Lopressor 12.5 mg daily.   PHYSICAL EXAMINATION:  VITAL SIGNS: Blood pressure 116/62, pulse  59,  regular, weight 156.  GENERAL: A general 77 year old female.  HEENT: Normocephalic atraumatic.  NECK: Palpable bilateral carotid pulses with faint, distant, right  carotid bruit; and no obvious bruit on the left.  LUNGS: Clear to auscultational fields.  HEART: Regular rate and rhythm (S1, S2), soft grade 2/6 systolic  ejection murmur heard loudest at the base.  ABDOMEN: Soft, nontender, intact bowel sounds.  EXTREMITIES: Palpable distal pulses with 1+ pedal edema.  NEUROLOGIC: Flat affect, but no focal deficit.   Electrocardiogram today revealed sinus bradycardia with first degree  atrial ventricular block with normal axis and downsloping ST depression  in EVL with voltage criteria for LVH.   REVIEW OF SYSTEMS:  Denies exertional dyspnea, PND, orthopnea or lower  extremity edema.   IMPRESSION:  1. Coronary artery disease.      a.     Status post acute interior myocardial infarction/CYPHER       stenting 100% occluded proximal LAD, January 2004.      b.  Residual non-obstructive coronary artery disease.      c.     Preserved left ventricular function with apical akinesis.  2. Aortic stenosis.      a.     Mild by 2D echocardiogram, January 2007.  3. History of asystole.      a.     Status post [employer] Nova single chamber pacemaker,       November 1995 [facility]).      b.     Followed by Dr. Sherryl Manges.  4. Dyslipidemia.      a.     Followed by Dr. Weyman Pedro.  5. Hypertension - well controlled.  6. First degree atrio-ventricular block.  7. Right carotid bruit.   PLAN:  1. Recommendation is to schedule an adenosine stress Cardiolite for      risk stratification. The patient is a somewhat difficult historian      and is unable to provide a very clear history to me of whether or      not she is having any symptoms suggestive of coronary artery      disease progression. Moreover, it is now 4 years out since she      underwent percutaneous intervention.  2. Schedule  carotid Dopplers for further assessment of right carotid      bruit and to exclude significant cerebral vascular disease.  3. I provided the patient with a prescription for nitroglycerin for      p.r.n. use.  4. Schedule return clinic followup with myself and Dr. Simona Huh in      1 month.      Rozell Searing, PA-C  Electronically Signed      [name], MD  Electronically Signed   GS/MedQ  DD: 03/31/2006  DT: 04/01/2006  Job #: [id number]   cc:   Weyman Pedro

## 2010-08-06 NOTE — Discharge Summary (Signed)
NAME:  Jodi, Dyer NO.:  [id number]   MEDICAL RECORD NO.:  [id number]                   PATIENT TYPE:  INP   LOCATION:  3703                                 FACILITY:  [facility]   PHYSICIAN:  [name], M.D. [facility]        DATE OF BIRTH:  [birth year]   DATE OF ADMISSION:  04/15/2002  DATE OF DISCHARGE:  04/18/2002                           DISCHARGE SUMMARY - REFERRING   HISTORY OF PRESENT ILLNESS:  The patient is a 77 year old female who was  seen at [facility] in [place], [place], on April 15, 2002, by  [name], M.D., for evaluation of chest pain.  She has a history  of hypertension, hyperlipidemia, a previous history of syncope, and is now  status post a VVI pacemaker placed in 1995 at [facility].  She  apparently developed chest pain at rest at approximately 2 a.m. on the  morning of admission.  Cardiac enzymes were noted to be abnormal.  She was  treated with IV nitroglycerin and heparin and admitted for further  evaluation.   PAST MEDICAL HISTORY:  The patient has a history of asystole during  induction of anesthesia with subsequent permanent pacemaker implant.  She  has a previous history of syncope.  The pacemaker was placed in 1995 at  [facility].  There is no previous history of coronary  artery disease.  She does have a history of glaucoma, DJD, vertigo,  hypertension, a history of dyslipidemia, and a history of biliary  pancreatitis in 1995.   ALLERGIES:  No known drug allergies.   SOCIAL HISTORY:  The patient is widowed.  She lives alone.  She has a  supportive family.  She denies alcohol or tobacco use.   FAMILY HISTORY:  Significant for coronary artery disease in the patient's  brother at age 76.   HOSPITAL COURSE:  As noted, this patient was admitted to [facility] on April 15, 2002, after being seen by [name], M.D., at [facility]  with chest pain and abnormal cardiac  enzymes.  The patient underwent cardiac catheterization on the day of  admission performed by Charlies Constable, M.D.  She was found to have a totally  occluded proximal LAD.  The circumflex had a 30% proximal lesion.  The RCA  had a 30% proximal lesion.  The left ventricle had some inferior apical  akinesis with an EF of 55%.  The patient had a stent placed in her LAD,  reducing the stenosis from 100% to less than 10%.  She tolerated this well.  It was recommended that she stay on Plavix for at least three months.   During the stay, the patient was noted to be hypokalemic.  This was  supplemented.  She was also placed on Zocor for dyslipidemia.   On April 16, 2002, the patient had an echocardiogram performed that  revealed an ejection fraction  of 55-60%.  The aortic valve was mildly  calcified with findings consistent with mild aortic stenosis.  The left  atrium was mildly dilated.   The patient's medications were adjusted and arrangements were eventually  made to discharge her in improved condition on April 18, 2002.   LABORATORY DATA:  A CBC on April 16, 2002, revealed hemoglobin 12.2,  hematocrit 34.8, WBC 5900, and platelets 152,000.  A chemistry profile on  the day of discharge revealed a BUN of 15, creatinine 0.7, potassium 3.8,  and glucose 123.  The patient did run mildly elevated glucose levels  throughout her stay.  On April 17, 2002, her potassium was 3.3.  This was  supplemented.  A CK-MB profile on April 16, 2002, revealed a CK of 988 and  an MB portion of 133.8.  A lipid profile revealed a cholesterol of 180,  triglycerides 166, HDL low at 39, and HDL 108.  Please see the results of  echocardiogram as noted above.  No chest x-ray was performed at [facility].  [facility].   DISCHARGE MEDICATIONS:  1. Enteric-coated aspirin 81 mg daily.  She has a history of GI upset with     aspirin.  2. Plavix 75 mg daily for at  least three months.  3. Lopressor 50 mg one-quarter tablet twice daily.  4. Zocor 80 mg at bedtime.  5. Altace 2.5 mg daily.  6. Nitroglycerin p.r.n. for chest pain.  7. Protonix 40 mg daily.   The patient was concerned about the costs of her medications.  She was told  that if she could not afford all of these medications to forego the  Protonix.   ACTIVITY:  The patient was told to avoid any strenuous activity.  She was  not to lift more than 10 pounds for one week.   DIET:  She was to be on a low-salt, low-fat diet.   WOUND CARE:  She was to call the office if she had increased pain, swelling,  or bleeding from her groin.   FOLLOW-UP:  The patient is to have a lipid profile and liver function tests  in approximately six weeks.  She will follow up with [name],  M.D., on May 16, 2002, at 1:30 p.m.  She will see Weyman Pedro, M.D.,  in approximately two weeks.   PROBLEM LIST AT THE TIME OF DISCHARGE:  1. Status post myocardial infarction.  2. Percutaneous transluminal coronary angiography and stent of the left     anterior descending performed on April 15, 2002.  3. Preserved left ventricular function with an ejection fraction of 55-60%.  4. History of permanent pacemaker secondary to asystole and syncope.  5. History of glaucoma.  6. History of degenerative joint disease.  7. History of vertigo.  8. History of dyslipidemia.  9. History of hypertension.  10.      History of biliary pancreatitis in 1995  11.     Hypokalemia, corrected.  12.      2-D echocardiogram performed this admission.  See results above.  13.      Mild hypotension.     Delton See, P.A. [facility]                  [name], M.D. [facility]    DR/MEDQ  D:  04/18/2002  T:  04/18/2002  Job:  [id number]   cc:   Weyman Pedro, M.D.  Jonita Albee, [place]   [facility]  [phone]  [address]  [address]  [place], [place] [address]

## 2010-08-25 ENCOUNTER — Telehealth: Payer: Self-pay | Admitting: *Deleted

## 2010-08-25 NOTE — Telephone Encounter (Signed)
Pt left message on voicemail stating she would like Dr. Diona Browner to send an order to the [facility] for "complete blood work" b/c she is on Zocor.  Spoke with pt who states she has not seen Dr. Diona Browner in a while b/c of transportation issues. She states she sees Psychiatric nurse at [facility]. She states she plans eye surgery soon and would like this lab work done first. Pt is aware she needs f/u with Dr. Diona Browner as she has not seen him since 2009 despite having a reminder for April 2010. Pt verbalized understanding. Pt does have transportation issue so will plan OV w/Dr. Diona Browner on Friday June 8th. Pt will come fasting for this appt w/plans to do labs afterwards.

## 2010-08-26 ENCOUNTER — Encounter: Payer: Self-pay | Admitting: *Deleted

## 2010-08-27 ENCOUNTER — Ambulatory Visit: Payer: Medicare Other | Admitting: Cardiology

## 2010-08-30 ENCOUNTER — Encounter: Payer: Self-pay | Admitting: *Deleted

## 2010-10-13 ENCOUNTER — Ambulatory Visit (HOSPITAL_COMMUNITY)
Admission: RE | Admit: 2010-10-13 | Discharge: 2010-10-13 | Disposition: A | Discharge status: Home / Self Care | Payer: Medicare Other | Source: Ambulatory Visit | Attending: Ophthalmology | Admitting: Ophthalmology

## 2010-10-13 ENCOUNTER — Ambulatory Visit (HOSPITAL_COMMUNITY): Payer: Medicare Other

## 2010-10-13 DIAGNOSIS — Z79899 Other long term (current) drug therapy: Secondary | ICD-10-CM | POA: Insufficient documentation

## 2010-10-13 DIAGNOSIS — I10 Essential (primary) hypertension: Secondary | ICD-10-CM | POA: Insufficient documentation

## 2010-10-13 DIAGNOSIS — Z950 Presence of cardiac pacemaker: Secondary | ICD-10-CM | POA: Insufficient documentation

## 2010-10-13 DIAGNOSIS — H4011X0 Primary open-angle glaucoma, stage unspecified: Secondary | ICD-10-CM | POA: Insufficient documentation

## 2010-10-13 DIAGNOSIS — Z01812 Encounter for preprocedural laboratory examination: Secondary | ICD-10-CM | POA: Insufficient documentation

## 2010-10-13 DIAGNOSIS — Z01818 Encounter for other preprocedural examination: Secondary | ICD-10-CM | POA: Insufficient documentation

## 2010-10-13 DIAGNOSIS — E213 Hyperparathyroidism, unspecified: Secondary | ICD-10-CM | POA: Insufficient documentation

## 2010-10-13 DIAGNOSIS — R42 Dizziness and giddiness: Secondary | ICD-10-CM | POA: Insufficient documentation

## 2010-10-13 DIAGNOSIS — I2510 Atherosclerotic heart disease of native coronary artery without angina pectoris: Secondary | ICD-10-CM | POA: Insufficient documentation

## 2010-10-13 DIAGNOSIS — K5730 Diverticulosis of large intestine without perforation or abscess without bleeding: Secondary | ICD-10-CM | POA: Insufficient documentation

## 2010-10-13 DIAGNOSIS — E785 Hyperlipidemia, unspecified: Secondary | ICD-10-CM | POA: Insufficient documentation

## 2010-10-13 DIAGNOSIS — K219 Gastro-esophageal reflux disease without esophagitis: Secondary | ICD-10-CM | POA: Insufficient documentation

## 2010-10-13 DIAGNOSIS — H409 Unspecified glaucoma: Secondary | ICD-10-CM | POA: Insufficient documentation

## 2010-10-13 LAB — CBC
Hemoglobin: 15.1 g/dL — ABNORMAL HIGH (ref 12.0–15.0)
MCH: 31.2 pg (ref 26.0–34.0)
MCV: 88.2 fL (ref 78.0–100.0)
RBC: 4.84 MIL/uL (ref 3.87–5.11)

## 2010-10-13 LAB — BASIC METABOLIC PANEL
CO2: 29 mEq/L (ref 19–32)
Calcium: 11.6 mg/dL — ABNORMAL HIGH (ref 8.4–10.5)
Glucose, Bld: 93 mg/dL (ref 70–99)
Sodium: 140 mEq/L (ref 135–145)

## 2010-10-13 LAB — SURGICAL PCR SCREEN: Staphylococcus aureus: NEGATIVE

## 2010-10-21 ENCOUNTER — Encounter: Payer: Medicare Other | Admitting: *Deleted

## 2010-10-26 ENCOUNTER — Encounter: Payer: Self-pay | Admitting: *Deleted

## 2010-11-05 ENCOUNTER — Telehealth: Payer: Self-pay | Admitting: *Deleted

## 2010-11-05 NOTE — Telephone Encounter (Signed)
Contacted pt and gave directions let pt know I would f/u with her on Monday.

## 2010-11-05 NOTE — Telephone Encounter (Signed)
Pt called about letter she received about her remote check.  She does not understand or know what to do next.

## 2010-11-08 ENCOUNTER — Ambulatory Visit (INDEPENDENT_AMBULATORY_CARE_PROVIDER_SITE_OTHER): Payer: Medicare Other | Admitting: *Deleted

## 2010-11-08 ENCOUNTER — Other Ambulatory Visit: Payer: Self-pay

## 2010-11-08 DIAGNOSIS — I498 Other specified cardiac arrhythmias: Secondary | ICD-10-CM

## 2010-11-08 DIAGNOSIS — E210 Primary hyperparathyroidism: Secondary | ICD-10-CM

## 2010-11-08 LAB — REMOTE PACEMAKER DEVICE
BMOD-0002RV: 10
BRDY-0004RV: 120 {beats}/min
RV LEAD IMPEDENCE PM: 540 Ohm

## 2010-11-08 NOTE — Telephone Encounter (Signed)
Spoke with pt and let her know that we still did not receive a transmission. I gave her the number to [employer] and asked her to call them to help her troubleshoot this issue.

## 2010-11-12 ENCOUNTER — Encounter: Payer: Self-pay | Admitting: *Deleted

## 2010-11-12 NOTE — Progress Notes (Signed)
Pacer remote check  

## 2011-01-06 NOTE — Op Note (Signed)
Jodi Dyer, MEDICI NO.:  [id number]  MEDICAL RECORD NO.:  [id number]  LOCATION:  [facility]                         FACILITY:  [facility]  PHYSICIAN:  Chalmers Guest, M.D.     DATE OF BIRTH:  [birth year]  DATE OF PROCEDURE:  10/13/2010 DATE OF DISCHARGE:  10/13/2010                              OPERATIVE REPORT   PREOPERATIVE DIAGNOSIS:  Uncontrolled primary open-angle glaucoma despite maximum tolerated medical therapy and maximum laser therapy. The surgical procedure is medically indicated because the patient is losing visual field, losing vision, and the optic nerve is continuing to dye threatening blindness.  POSTOPERATIVE DIAGNOSIS:  Uncontrolled primary open-angle glaucoma despite maximum tolerated medical therapy and maximum laser therapy. The surgical procedure is medically indicated because the patient is losing visual field, losing vision, and the optic nerve is continuing to dye threatening blindness.  The patient's eye has preexisting scarring from previous cataract surgery.  PROCEDURE:  A mini glaucoma shunt Express preloaded on EBS with mitomycin C.  ANESTHESIA:  Xylocaine 2% with 50:50 mixture of 0.75% Marcaine with an ampule of Wydase.  COMPLICATIONS:  None.  PROCEDURE:  The patient was given a peribulbar block in the operative room under monitored anesthesia with the aforementioned local anesthetic agent.  Following this, the patient's face was prepped and draped in the usual sterile fashion.  With the surgeon sitting superiorly at the 12 o'clock position, a 6-0 nylon suture was passed through clear cornea to infraduct the eye.  The area of superior nasal conjunctival scarring was noted and the eye was noted to be 3+ conjunctival injection.  A Hoskin forceps was used to grasp the conjunctiva at 12 o'clock.  An incision was made with a Westcott scissor at the limbus.  A blunt Westcott was then used to form a fornix-based conjunctival flap.  Bleeding  was controlled with cautery.  Following this, a 45-degree blade was used to fashion a half-thickness scleral flap.  A Maumenee-Colibri  forceps was used to grasp the scleral flap and a Grieshaber blade was used to dissect up to the limbus.  Following this, mitomycin C 0.4 mg/mL was placed on a Gelfoam sponge and allowed to stay under the superior conjunctiva for 2 minutes.  It was then removed and irrigated with 50 mL of balanced salt solution.  Following this, a 51-degree blade was used through inferior clear cornea to enter the eye and Provisc was injected. At this point, a 26-gauge needle was used to elevate the scleral flap and enter the anterior chamber without touching the iris.  Then, the Express shunt which was an Express preloaded on EBS P 200, SN number [id number] was entered the anterior chamber through the aforementioned needle tract.  It was positioned.  Additional Provisc was injected in the eye.  Four interrupted 10-0 nylon sutures were used to suture the scleral flap which at this point was thin and had 1 thin area that was sutured tightly.  Following this, the conjunctiva was then sutured with a 9-0 Vicryl on a BV100 needle on each corner and sutured for watertight closure to bleb elevate it at the end of the closure.  BSS was injected. The chamber remained  deep throughout the case.  The incision was stained with fluorescein and noted to be Seidel negative.  A subconjunctival injection of Kenalog 4 mg was given in the inferior nasal subconjunctiva.  The lid speculum was removed and topical TobraDex ointment was applied to the eye.  A patch and Fox shield were placed and the patient returned to recovery area in stable condition.     Chalmers Guest, M.D.     RW/MEDQ  D:  10/13/2010  T:  10/14/2010  Job:  [id number]  Electronically Signed by Chalmers Guest M.D. on 01/06/2011 05:57:52 PM

## 2011-02-11 ENCOUNTER — Encounter: Payer: Medicare Other | Admitting: *Deleted

## 2011-02-12 ENCOUNTER — Encounter: Payer: Self-pay | Admitting: *Deleted

## 2011-02-17 ENCOUNTER — Other Ambulatory Visit: Payer: Self-pay

## 2011-02-17 ENCOUNTER — Ambulatory Visit (INDEPENDENT_AMBULATORY_CARE_PROVIDER_SITE_OTHER): Payer: Medicare Other | Admitting: *Deleted

## 2011-02-17 DIAGNOSIS — I498 Other specified cardiac arrhythmias: Secondary | ICD-10-CM

## 2011-02-18 ENCOUNTER — Encounter: Payer: Self-pay | Admitting: Internal Medicine

## 2011-02-18 ENCOUNTER — Other Ambulatory Visit: Payer: Self-pay

## 2011-02-18 LAB — PACEMAKER DEVICE OBSERVATION
BATTERY VOLTAGE: 2.9779 V
BRDY-0002RV: 50 {beats}/min
RV LEAD AMPLITUDE: 11 mv
RV LEAD IMPEDENCE PM: 575 Ohm

## 2011-02-18 LAB — REMOTE PACEMAKER DEVICE
BMOD-0002RV: 10
DEVICE MODEL PM: 2259345

## 2011-02-22 NOTE — Progress Notes (Signed)
Remote pacer check  

## 2011-03-17 ENCOUNTER — Encounter: Payer: Self-pay | Admitting: *Deleted

## 2011-05-10 DIAGNOSIS — I359 Nonrheumatic aortic valve disorder, unspecified: Secondary | ICD-10-CM

## 2011-05-26 ENCOUNTER — Encounter: Payer: Medicare Other | Admitting: *Deleted

## 2011-05-30 ENCOUNTER — Encounter: Payer: Self-pay | Admitting: *Deleted

## 2011-06-01 ENCOUNTER — Other Ambulatory Visit: Payer: Medicare Other

## 2011-06-03 ENCOUNTER — Other Ambulatory Visit: Payer: Medicare Other

## 2011-06-06 ENCOUNTER — Ambulatory Visit (INDEPENDENT_AMBULATORY_CARE_PROVIDER_SITE_OTHER): Payer: Medicare Other | Admitting: *Deleted

## 2011-06-06 ENCOUNTER — Encounter: Payer: Self-pay | Admitting: Internal Medicine

## 2011-06-06 ENCOUNTER — Other Ambulatory Visit: Payer: Self-pay | Admitting: Internal Medicine

## 2011-06-06 DIAGNOSIS — I498 Other specified cardiac arrhythmias: Secondary | ICD-10-CM

## 2011-06-07 LAB — REMOTE PACEMAKER DEVICE
BRDY-0004RV: 120 {beats}/min
RV LEAD AMPLITUDE: 9.6 mv
RV LEAD IMPEDENCE PM: 540 Ohm
VENTRICULAR PACING PM: 5.4

## 2011-06-09 ENCOUNTER — Other Ambulatory Visit: Payer: Medicare Other

## 2011-06-09 NOTE — Progress Notes (Signed)
Remote pacer check  

## 2011-06-13 ENCOUNTER — Encounter: Payer: Self-pay | Admitting: *Deleted

## 2011-06-14 ENCOUNTER — Ambulatory Visit (INDEPENDENT_AMBULATORY_CARE_PROVIDER_SITE_OTHER): Payer: Medicare Other | Admitting: *Deleted

## 2011-06-14 DIAGNOSIS — I6529 Occlusion and stenosis of unspecified carotid artery: Secondary | ICD-10-CM

## 2011-06-14 DIAGNOSIS — Z48812 Encounter for surgical aftercare following surgery on the circulatory system: Secondary | ICD-10-CM

## 2011-06-16 ENCOUNTER — Encounter: Payer: Self-pay | Admitting: Internal Medicine

## 2011-06-29 ENCOUNTER — Other Ambulatory Visit: Payer: Self-pay | Admitting: *Deleted

## 2011-06-29 DIAGNOSIS — Z48812 Encounter for surgical aftercare following surgery on the circulatory system: Secondary | ICD-10-CM

## 2011-06-29 DIAGNOSIS — I6529 Occlusion and stenosis of unspecified carotid artery: Secondary | ICD-10-CM

## 2011-06-29 NOTE — Procedures (Unsigned)
CAROTID DUPLEX EXAM  INDICATION:  Followup right CEA.  HISTORY: Diabetes:  No Cardiac:  Yes Hypertension:  Yes Smoking:  No Previous Surgery:  Right CEA 07/10/2006 CV History:  Currently asymptomatic Amaurosis Fugax No, Paresthesias No, Hemiparesis No                                      RIGHT             LEFT Brachial systolic pressure:         147               145 Brachial Doppler waveforms:         Normal            Normal Vertebral direction of flow:        Antegrade         Antegrade DUPLEX VELOCITIES (cm/sec) CCA peak systolic                   64                70 ECA peak systolic                   94                93 ICA peak systolic                   90                88 ICA end diastolic                   32                16 PLAQUE MORPHOLOGY:                  Heterogeneous     Heterogeneous PLAQUE AMOUNT:                      Minimal           Minimal PLAQUE LOCATION:                    ICA               ICA  IMPRESSION: 1. Patent right carotid endarterectomy site with no evidence of     restenosis. 2. Left internal carotid artery velocities suggest 1%-39% stenosis. 3. Antegrade vertebral arteries bilaterally. 4. Stable in comparison to the previous exam.  ___________________________________________ Di Kindle. Edilia Bo, M.D.  EM/MEDQ  D:  06/14/2011  T:  06/14/2011  Job:  [id number]

## 2011-06-30 ENCOUNTER — Encounter: Payer: Self-pay | Admitting: Vascular Surgery

## 2011-07-29 ENCOUNTER — Ambulatory Visit (INDEPENDENT_AMBULATORY_CARE_PROVIDER_SITE_OTHER): Payer: Medicare Other | Admitting: *Deleted

## 2011-07-29 ENCOUNTER — Encounter: Payer: Self-pay | Admitting: Internal Medicine

## 2011-07-29 DIAGNOSIS — I498 Other specified cardiac arrhythmias: Secondary | ICD-10-CM

## 2011-07-29 LAB — PACEMAKER DEVICE OBSERVATION
BRDY-0002RV: 50 {beats}/min
DEVICE MODEL PM: 2259345
RV LEAD IMPEDENCE PM: 550 Ohm

## 2011-07-29 NOTE — Progress Notes (Signed)
Pacer check in clinic  

## 2011-08-25 IMAGING — CR DG CHEST 2V
2 series · 2 of 2 positions shown · non-contrast
Comparison: Chest x-ray dated 07/06/2006

CLINICAL DATA: Preoperative respiratory exam.  Glaucoma.

CHEST - 2 VIEW

[view not recorded (1 of 2)]
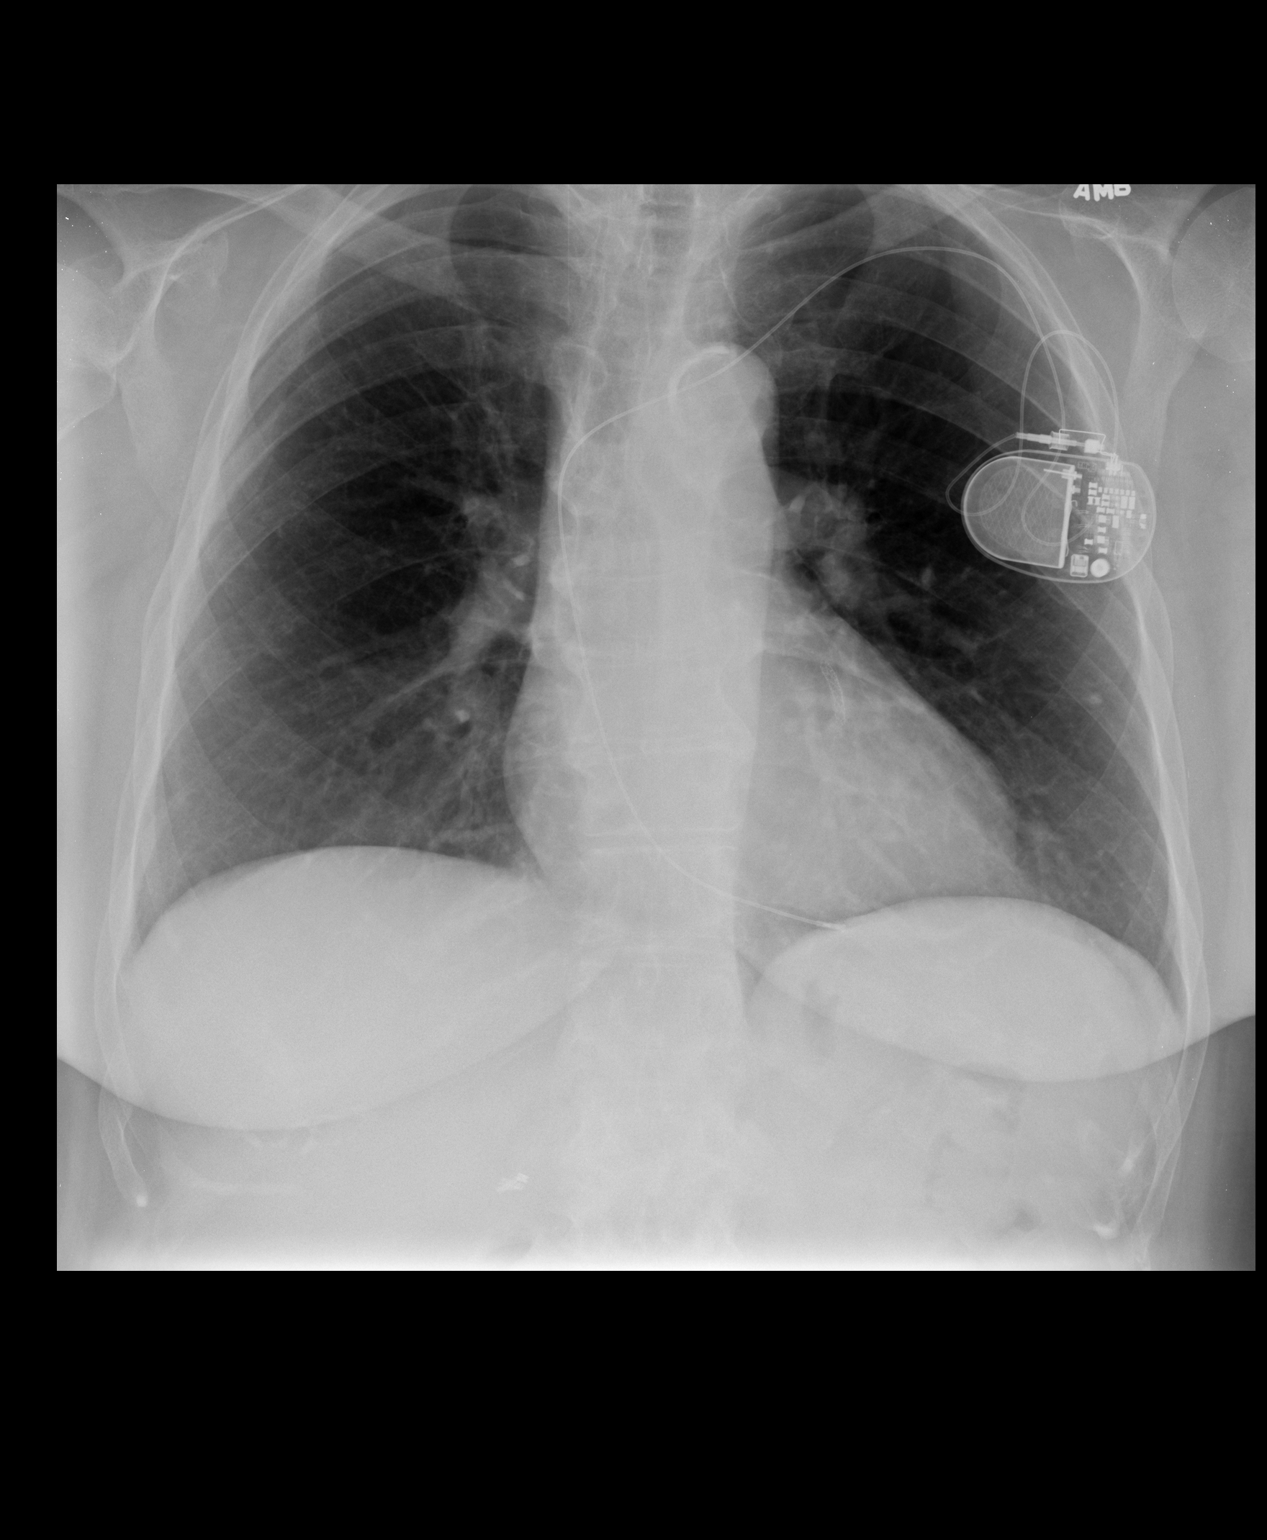

[view not recorded (2 of 2)]
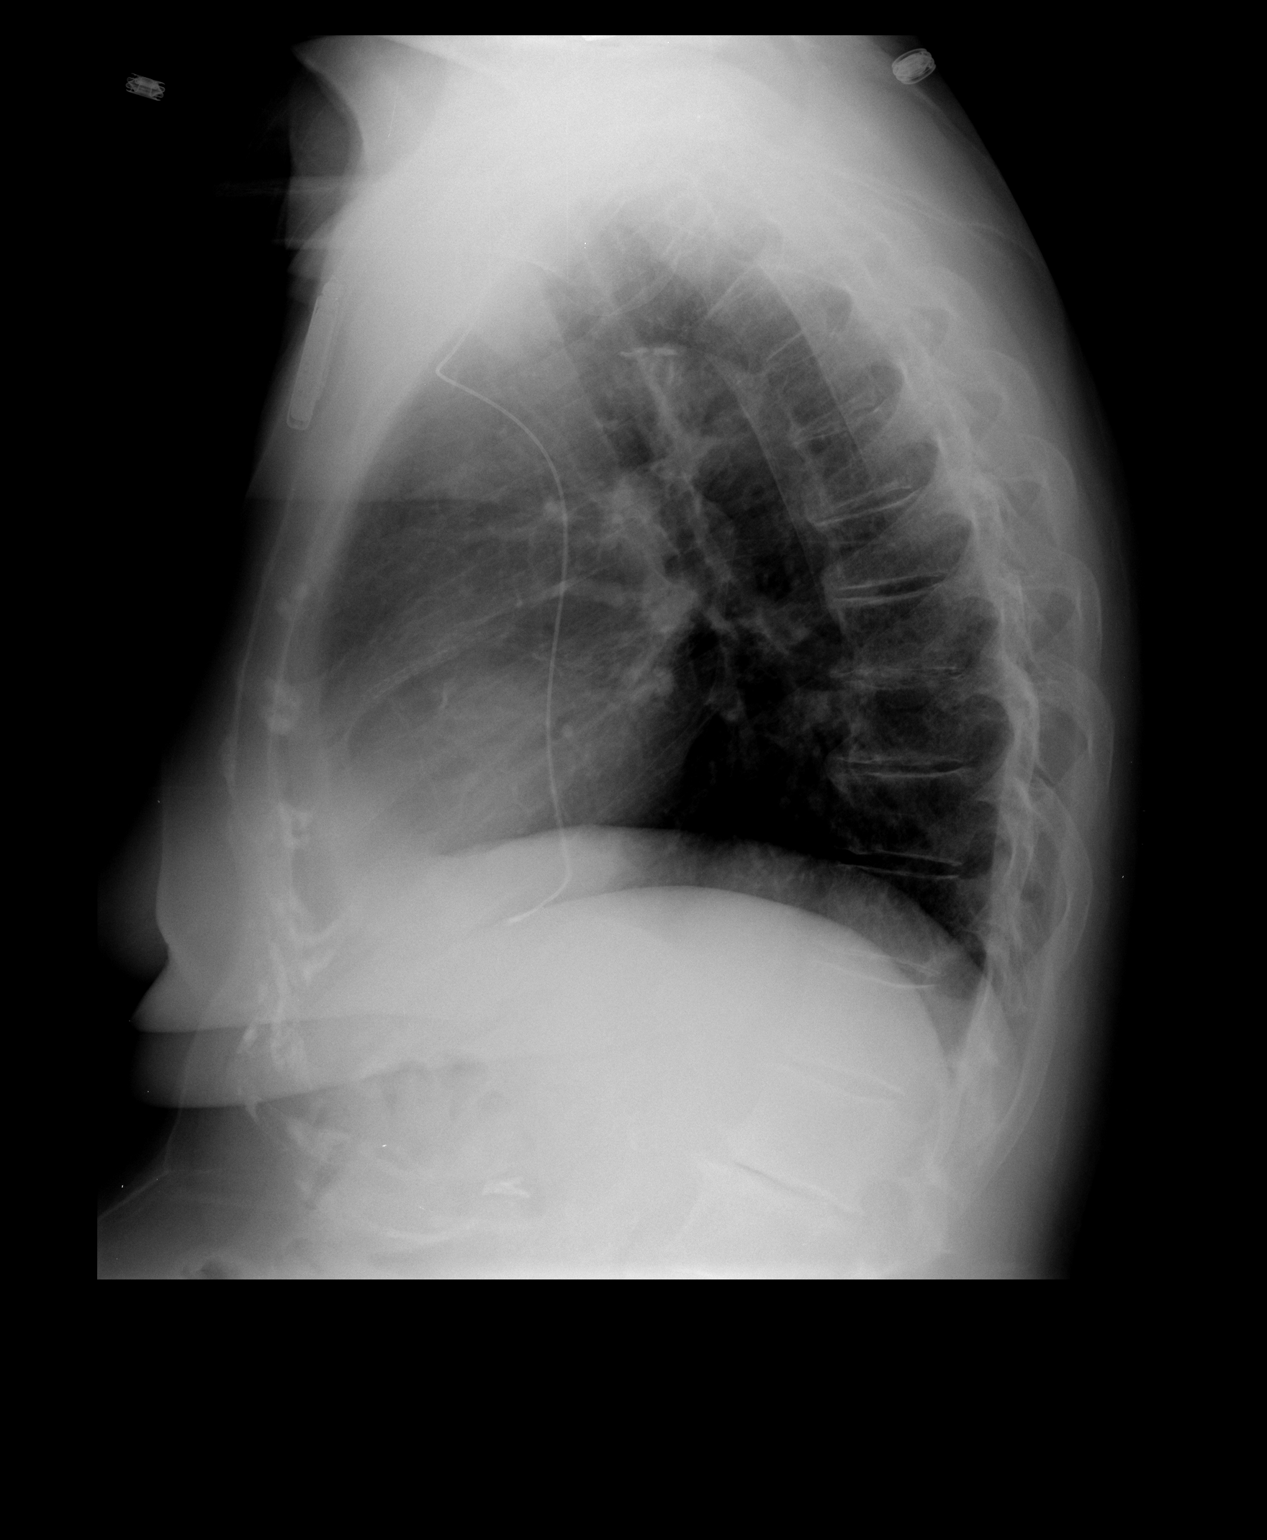

[2 of 2 positions shown; findings below may reference images not displayed]

FINDINGS: Heart size and vascularity are normal.  Single lead pacer
is in place.  Lungs are clear except for a small granuloma in the
left lung base, stable.  Evidence of coronary artery stent.  No
acute osseous abnormality.
IMPRESSION: No acute disease.

## 2011-10-03 ENCOUNTER — Telehealth: Payer: Self-pay | Admitting: *Deleted

## 2011-10-03 NOTE — Telephone Encounter (Signed)
Larey Seat on July 5th and area around pacemaker is very sore. Patient is requesting to be seen by MD to have area checked.

## 2011-10-03 NOTE — Telephone Encounter (Signed)
I am happy to see her in [place] on Thursday am.

## 2011-10-04 ENCOUNTER — Telehealth: Payer: Self-pay | Admitting: Internal Medicine

## 2011-10-04 NOTE — Telephone Encounter (Signed)
Patient fell on the side her pace maker is on and she is now bruised around area.  She wants to see if she needs to come in and have checked?

## 2011-10-04 NOTE — Telephone Encounter (Signed)
Pt scheduled for 10-06-11 @ 1015 with Dr Johney Frame.

## 2011-10-04 NOTE — Telephone Encounter (Signed)
Patient informed and appointment given.

## 2011-10-06 ENCOUNTER — Ambulatory Visit (INDEPENDENT_AMBULATORY_CARE_PROVIDER_SITE_OTHER): Payer: Medicare Other | Admitting: Internal Medicine

## 2011-10-06 ENCOUNTER — Encounter: Payer: Self-pay | Admitting: Internal Medicine

## 2011-10-06 VITALS — BP 131/77 | HR 56 | Ht 63.0 in | Wt 144.0 lb

## 2011-10-06 DIAGNOSIS — I2510 Atherosclerotic heart disease of native coronary artery without angina pectoris: Secondary | ICD-10-CM

## 2011-10-06 DIAGNOSIS — I498 Other specified cardiac arrhythmias: Secondary | ICD-10-CM

## 2011-10-06 LAB — PACEMAKER DEVICE OBSERVATION
BATTERY VOLTAGE: 2.9779 V
BMOD-0002RV: 10
BRDY-0002RV: 50 {beats}/min
BRDY-0004RV: 120 {beats}/min
DEVICE MODEL PM: 2259345
RV LEAD AMPLITUDE: 9.6 mv
RV LEAD IMPEDENCE PM: 562.5 Ohm
RV LEAD THRESHOLD: 0.75 V
VENTRICULAR PACING PM: 7.8

## 2011-10-06 NOTE — Assessment & Plan Note (Signed)
Normal pacemaker function, though she continues to have noise on her ventricular lead this actually less than on her previous visit.  As the impedance and threshold are stable, we will continue to monitor this.  We could adjust sensitivity in the future if necessary. See Arita Miss Art report No changes today

## 2011-10-06 NOTE — Assessment & Plan Note (Signed)
No ischemic symptoms 

## 2011-10-06 NOTE — Patient Instructions (Addendum)
Your physician recommends that you schedule a follow-up appointment in: 1 year with Dr. Johney Frame. You will receive a reminder letter in the mail in about 10 months reminding you to call and schedule your appointment. If you don't receive this letter, please contact our office.  Device check 01/09/12. Your physician recommends that you continue on your current medications as directed. Please refer to the Current Medication list given to you today.

## 2011-10-06 NOTE — Progress Notes (Signed)
PCP: Donzetta Sprung, MD  The patient presents today for routine electrophysiology followup. She remains active for her age.  She fell 2 weeks ago while picking up leaves in her yard.  She lost her balance and did not have syncope.  She bruised her L chest with the fall and therefore presents today to have her pacemaker interrogated.  Today, she denies symptoms of  chest pain, shortness of breath, lower extremity edema, dizziness, presyncope, syncope, or neurologic sequela.    Past Medical History  Diagnosis Date  . Aortic valve stenosis   . CAD (coronary artery disease)   . Hypertension   . Unspecified glaucoma   . Bradycardia     s/p PPM  . Hyperlipidemia    Past Surgical History  Procedure Date  . Pacemaker insertion 1995    single-chamber pacemaker placement  . Abdominal hysterectomy   . Cholecystectomy     Current Outpatient Prescriptions  Medication Sig Dispense Refill  . aspirin 325 MG tablet Take 325 mg by mouth daily.        . metoprolol tartrate (LOPRESSOR) 25 MG tablet Take 0.5 tablets by mouth 2 (two) times daily.       Marland Kitchen PILOPINE HS 4 % ophthalmic gel Place 1 drop into the left eye At bedtime.      . ramipril (ALTACE) 2.5 MG capsule Take 1 tablet by mouth daily.      . simvastatin (ZOCOR) 80 MG tablet Take 40 mg by mouth daily.       . TRAVATAN Z 0.004 % ophthalmic solution Place 1 drop into both eyes At bedtime.        No Known Allergies  History   Social History  . Marital Status: Widowed    Spouse Name: N/A    Number of Children: N/A  . Years of Education: N/A   Occupational History  . Not on file.   Social History Main Topics  . Smoking status: Never Smoker   . Smokeless tobacco: Not on file  . Alcohol Use: No  . Drug Use: No  . Sexually Active: Not on file   Other Topics Concern  . Not on file   Social History Narrative   Lives in [place]   Physical Exam: Filed Vitals:   10/06/11 1021  BP: 131/77  Pulse: 56  Height: 5\' 3"  (1.6 m)    Weight: 144 lb (65.318 kg)  SpO2: 97%    GEN- The patient is well appearing, alert and oriented x 3 today.   Head- normocephalic, atraumatic Eyes-  Sclera clear, conjunctiva pink Ears- hearing intact Oropharynx- clear Neck- supple, no JVP Lymph- no cervical lymphadenopathy Lungs- Clear to ausculation bilaterally, normal work of breathing Chest- pacemaker pocket is well healed Heart- Regular rate and rhythm, 2/6 SEM LUSB (early peaking) GI- soft, NT, ND, + BS Extremities- no clubbing, cyanosis, or edema  Pacemaker interrogation- reviewed in detail today,  See PACEART report  Assessment and Plan:

## 2012-01-09 ENCOUNTER — Encounter: Payer: Medicare Other | Admitting: *Deleted

## 2012-01-10 ENCOUNTER — Encounter: Payer: Self-pay | Admitting: *Deleted

## 2012-02-15 ENCOUNTER — Encounter: Payer: Self-pay | Admitting: *Deleted

## 2012-02-15 ENCOUNTER — Encounter: Payer: Self-pay | Admitting: Internal Medicine

## 2012-02-15 ENCOUNTER — Ambulatory Visit (INDEPENDENT_AMBULATORY_CARE_PROVIDER_SITE_OTHER): Payer: Medicare Other | Admitting: *Deleted

## 2012-02-15 DIAGNOSIS — Z950 Presence of cardiac pacemaker: Secondary | ICD-10-CM

## 2012-02-15 DIAGNOSIS — I498 Other specified cardiac arrhythmias: Secondary | ICD-10-CM

## 2012-02-15 LAB — REMOTE PACEMAKER DEVICE
BRDY-0002RV: 50 {beats}/min
BRDY-0004RV: 120 {beats}/min
DEVICE MODEL PM: 2259345
RV LEAD AMPLITUDE: 10.2 mv

## 2012-02-29 ENCOUNTER — Encounter: Payer: Self-pay | Admitting: *Deleted

## 2012-05-21 ENCOUNTER — Encounter: Payer: Medicare Other | Admitting: *Deleted

## 2012-05-24 ENCOUNTER — Encounter: Payer: Self-pay | Admitting: *Deleted

## 2012-05-28 ENCOUNTER — Telehealth: Payer: Self-pay | Admitting: Internal Medicine

## 2012-05-28 NOTE — Telephone Encounter (Signed)
New Problem:     Patient called in because she received a letter stating that she missed her transmission appt but claims that she sent one three days later.  Please call back.

## 2012-05-28 NOTE — Telephone Encounter (Signed)
Tech services # was given to pt with instructions to call. Pt voices understanding.

## 2012-05-29 ENCOUNTER — Ambulatory Visit (INDEPENDENT_AMBULATORY_CARE_PROVIDER_SITE_OTHER): Payer: Medicare Other | Admitting: *Deleted

## 2012-05-29 ENCOUNTER — Other Ambulatory Visit: Payer: Self-pay | Admitting: Internal Medicine

## 2012-05-29 DIAGNOSIS — I498 Other specified cardiac arrhythmias: Secondary | ICD-10-CM

## 2012-05-29 DIAGNOSIS — Z950 Presence of cardiac pacemaker: Secondary | ICD-10-CM

## 2012-05-30 LAB — REMOTE PACEMAKER DEVICE
BATTERY VOLTAGE: 2.99 V
BMOD-0002RV: 10
BRDY-0002RV: 50 {beats}/min
DEVICE MODEL PM: 2259345
RV LEAD IMPEDENCE PM: 510 Ohm

## 2012-06-01 ENCOUNTER — Ambulatory Visit: Payer: Medicare Other | Admitting: Neurosurgery

## 2012-06-01 ENCOUNTER — Other Ambulatory Visit (INDEPENDENT_AMBULATORY_CARE_PROVIDER_SITE_OTHER): Payer: Medicare Other

## 2012-06-05 ENCOUNTER — Encounter: Payer: Self-pay | Admitting: Vascular Surgery

## 2012-06-05 ENCOUNTER — Other Ambulatory Visit: Payer: Self-pay | Admitting: *Deleted

## 2012-07-03 ENCOUNTER — Encounter: Payer: Self-pay | Admitting: *Deleted

## 2012-07-09 ENCOUNTER — Encounter: Payer: Self-pay | Admitting: Internal Medicine

## 2012-09-28 DIAGNOSIS — R0989 Other specified symptoms and signs involving the circulatory and respiratory systems: Secondary | ICD-10-CM

## 2012-10-03 ENCOUNTER — Ambulatory Visit (INDEPENDENT_AMBULATORY_CARE_PROVIDER_SITE_OTHER): Payer: Medicare Other | Admitting: Internal Medicine

## 2012-10-03 ENCOUNTER — Encounter: Payer: Self-pay | Admitting: Internal Medicine

## 2012-10-03 VITALS — BP 144/90 | HR 94 | Ht 61.5 in | Wt 110.4 lb

## 2012-10-03 DIAGNOSIS — I2510 Atherosclerotic heart disease of native coronary artery without angina pectoris: Secondary | ICD-10-CM

## 2012-10-03 DIAGNOSIS — Z950 Presence of cardiac pacemaker: Secondary | ICD-10-CM

## 2012-10-03 DIAGNOSIS — I10 Essential (primary) hypertension: Secondary | ICD-10-CM

## 2012-10-03 DIAGNOSIS — I498 Other specified cardiac arrhythmias: Secondary | ICD-10-CM

## 2012-10-03 LAB — PACEMAKER DEVICE OBSERVATION
BATTERY VOLTAGE: 2.9779 V
BMOD-0002RV: 10
RV LEAD AMPLITUDE: 10.6 mv
RV LEAD THRESHOLD: 0.75 V
VENTRICULAR PACING PM: 5

## 2012-10-03 NOTE — Patient Instructions (Addendum)
Continue all current medications. Allred - 1 year  

## 2012-10-03 NOTE — Progress Notes (Signed)
PCP: Donzetta Sprung, MD  The patient presents today for routine electrophysiology followup. She remains active for her age.  She fell this past week after killing a snake.  She is clear that she did not pass out.  She lost her balance and did not have syncope.     Today, she denies symptoms of  chest pain, shortness of breath, lower extremity edema, dizziness, presyncope, syncope, or neurologic sequela.    Past Medical History  Diagnosis Date  . Aortic valve stenosis   . CAD (coronary artery disease)   . Hypertension   . Unspecified glaucoma(365.9)   . Bradycardia     s/p PPM  . Hyperlipidemia    Past Surgical History  Procedure Laterality Date  . Pacemaker insertion  1995    single-chamber pacemaker placement  . Abdominal hysterectomy    . Cholecystectomy      Current Outpatient Prescriptions  Medication Sig Dispense Refill  . aspirin 325 MG tablet Take 325 mg by mouth daily.        . metoprolol tartrate (LOPRESSOR) 25 MG tablet Take 0.5 tablets by mouth 2 (two) times daily.       . ramipril (ALTACE) 2.5 MG capsule Take 1 tablet by mouth daily.      . simvastatin (ZOCOR) 80 MG tablet Take 40 mg by mouth daily.       . TRAVATAN Z 0.004 % ophthalmic solution Place 1 drop into both eyes At bedtime.       No current facility-administered medications for this visit.    No Known Allergies  History   Social History  . Marital Status: Widowed    Spouse Name: N/A    Number of Children: N/A  . Years of Education: N/A   Occupational History  . Not on file.   Social History Main Topics  . Smoking status: Never Smoker   . Smokeless tobacco: Not on file  . Alcohol Use: No  . Drug Use: No  . Sexually Active: Not on file   Other Topics Concern  . Not on file   Social History Narrative   Lives in [place]   Physical Exam: Filed Vitals:   10/03/12 1317  BP: 144/90  Pulse: 94  Height: 5' 1.5" (1.562 m)  Weight: 110 lb 6.4 oz (50.077 kg)  SpO2: 97%    GEN- The  patient is well appearing, alert and oriented x 3 today.   Head- ecchymosis over her forehead s/p fall Eyes-  Sclera clear, conjunctiva pink Ears- hearing intact Oropharynx- clear Neck- supple, no JVP Lymph- no cervical lymphadenopathy Lungs- Clear to ausculation bilaterally, normal work of breathing Chest- pacemaker pocket is well healed Heart- Regular rate and rhythm, 2/6 SEM LUSB (early peaking) GI- soft, NT, ND, + BS Extremities- no clubbing, cyanosis, or edema  Pacemaker interrogation- reviewed in detail today,  See PACEART report  Assessment and Plan:   1. Symptomatic bradycardia Normal pacemaker function See Pace Art report No changes today  2. CAD Stable No change required today  3. HTN Stable No change required today  Return to the device clinic in 1 year

## 2012-10-15 ENCOUNTER — Encounter: Payer: Self-pay | Admitting: Internal Medicine

## 2013-04-25 ENCOUNTER — Other Ambulatory Visit: Payer: Self-pay | Admitting: Vascular Surgery

## 2013-04-25 DIAGNOSIS — Z48812 Encounter for surgical aftercare following surgery on the circulatory system: Secondary | ICD-10-CM

## 2013-04-25 DIAGNOSIS — I6529 Occlusion and stenosis of unspecified carotid artery: Secondary | ICD-10-CM

## 2013-06-07 ENCOUNTER — Other Ambulatory Visit (HOSPITAL_COMMUNITY): Payer: Medicare Other

## 2013-06-07 ENCOUNTER — Ambulatory Visit: Payer: Medicare Other | Admitting: Vascular Surgery

## 2013-10-03 ENCOUNTER — Encounter: Payer: Medicare Other | Admitting: Internal Medicine

## 2013-10-08 ENCOUNTER — Ambulatory Visit (INDEPENDENT_AMBULATORY_CARE_PROVIDER_SITE_OTHER): Payer: Medicare Other | Admitting: Internal Medicine

## 2013-10-08 ENCOUNTER — Encounter: Payer: Self-pay | Admitting: Internal Medicine

## 2013-10-08 VITALS — BP 165/81 | HR 76 | Ht 63.0 in | Wt 117.0 lb

## 2013-10-08 DIAGNOSIS — I10 Essential (primary) hypertension: Secondary | ICD-10-CM

## 2013-10-08 DIAGNOSIS — I498 Other specified cardiac arrhythmias: Secondary | ICD-10-CM

## 2013-10-08 DIAGNOSIS — Z950 Presence of cardiac pacemaker: Secondary | ICD-10-CM

## 2013-10-08 DIAGNOSIS — I2510 Atherosclerotic heart disease of native coronary artery without angina pectoris: Secondary | ICD-10-CM

## 2013-10-08 LAB — MDC_IDC_ENUM_SESS_TYPE_INCLINIC
Battery Remaining Longevity: 160.8 mo
Date Time Interrogation Session: 20150721111222
Implantable Pulse Generator Model: 1110
Implantable Pulse Generator Serial Number: 2259345
Lead Channel Impedance Value: 487.5 Ohm
Lead Channel Pacing Threshold Amplitude: 0.5 V
Lead Channel Pacing Threshold Pulse Width: 0.4 ms
Lead Channel Pacing Threshold Pulse Width: 0.4 ms
Lead Channel Setting Sensing Sensitivity: 2 mV
MDC IDC MSMT BATTERY VOLTAGE: 2.98 V
MDC IDC MSMT LEADCHNL RV PACING THRESHOLD AMPLITUDE: 0.5 V
MDC IDC MSMT LEADCHNL RV SENSING INTR AMPL: 8.7 mV
MDC IDC SET LEADCHNL RV PACING AMPLITUDE: 2.5 V
MDC IDC SET LEADCHNL RV PACING PULSEWIDTH: 0.4 ms
MDC IDC STAT BRADY RV PERCENT PACED: 11 %

## 2013-10-08 NOTE — Progress Notes (Signed)
PCP: Donzetta Sprung, MD  The patient presents today for routine electrophysiology followup. She remains active for her age.  Her daughter is with her today.  She states that her mother is beginning to show more confusion.  She continues to live alone with her son checking on her every day.  Today, she denies symptoms of  chest pain, shortness of breath, lower extremity edema, dizziness, presyncope, syncope, or neurologic sequela.    Past Medical History  Diagnosis Date  . Aortic valve stenosis   . CAD (coronary artery disease)   . Hypertension   . Unspecified glaucoma   . Bradycardia     s/p PPM  . Hyperlipidemia    Past Surgical History  Procedure Laterality Date  . Pacemaker insertion  1995    single-chamber pacemaker placement  . Abdominal hysterectomy    . Cholecystectomy      Current Outpatient Prescriptions  Medication Sig Dispense Refill  . aspirin 325 MG tablet Take 325 mg by mouth daily.        . metoprolol tartrate (LOPRESSOR) 25 MG tablet Take 0.5 tablets by mouth 2 (two) times daily.       . ramipril (ALTACE) 2.5 MG capsule Take 1 tablet by mouth daily.      . simvastatin (ZOCOR) 80 MG tablet Take 40 mg by mouth daily.       . TRAVATAN Z 0.004 % ophthalmic solution Place 1 drop into both eyes At bedtime.       No current facility-administered medications for this visit.    No Known Allergies  History   Social History  . Marital Status: Widowed    Spouse Name: N/A    Number of Children: N/A  . Years of Education: N/A   Occupational History  . Not on file.   Social History Main Topics  . Smoking status: Never Smoker   . Smokeless tobacco: Not on file  . Alcohol Use: No  . Drug Use: No  . Sexual Activity: Not on file   Other Topics Concern  . Not on file   Social History Narrative   Lives in [place]   Physical Exam: Filed Vitals:   10/08/13 1049  BP: 165/81  Pulse: 76  Height: 5\' 3"  (1.6 m)  Weight: 117 lb (53.071 kg)    GEN- The  patient is well appearing, alert and oriented x 3 today.   Head- ecchymosis over her forehead s/p fall Eyes-  Sclera clear, conjunctiva pink Ears- hearing intact Oropharynx- clear Neck- supple  Lungs- Clear to ausculation bilaterally, normal work of breathing Chest- pacemaker pocket is well healed Heart- Regular rate and rhythm, 2/6 SEM LUSB (mid peaking) GI- soft, NT, ND, + BS Extremities- no clubbing, cyanosis, or edema  Pacemaker interrogation- reviewed in detail today,  See PACEART report  Assessment and Plan:   1. Symptomatic sinus bradycardia Normal pacemaker function Ventricular noise is observed but without impedance/ pacing/ sensing changes.  Her ventricular lead is from 1995.  She is not dependant.  Given her advanced age, we will continue to monitor this.  Consider adjusting sensitivity on return.  I would like to avoid any procedure if able.   See Pace Art report No changes today  2. CAD/ aortic stenosis Stable I discussed exam findings of AS with the patient and her daughter.  I suspect that her AS is progressing however she remains asymptomatic.  They are clear that they are not interested in an echo at this point.  They  may be more willing to consider if she develops symptoms.  Given her advanced age, I think that this is a reasonable approach. No change required today  3. HTN Stable but elevated today Given her advanced age and fragility, I have make no changes today  Return to the device clinic in 1 year Merlin every 3 months

## 2013-10-08 NOTE — Patient Instructions (Addendum)
   Merlin device/home check January 09, 2014. Your physician recommends that you schedule a follow-up appointment in: 1 year with Dr. Johney FrameAllred. You will receive a reminder letter in the mail in about 10 months reminding you to call and schedule your appointment. If you don't receive this letter, please contact our office. Your physician recommends that you continue on your current medications as directed. Please refer to the Current Medication list given to you today.

## 2014-01-09 ENCOUNTER — Encounter: Payer: Medicare Other | Admitting: *Deleted

## 2014-01-09 ENCOUNTER — Telehealth: Payer: Self-pay | Admitting: Cardiology

## 2014-01-09 NOTE — Telephone Encounter (Signed)
Confirmed remote transmission with pt daughter.  

## 2014-01-15 ENCOUNTER — Encounter: Payer: Medicare Other | Admitting: *Deleted

## 2014-01-20 ENCOUNTER — Encounter: Payer: Self-pay | Admitting: Cardiology

## 2014-01-29 ENCOUNTER — Encounter: Payer: Self-pay | Admitting: Cardiology

## 2014-03-18 ENCOUNTER — Encounter: Payer: Self-pay | Admitting: *Deleted

## 2014-04-01 DIAGNOSIS — E785 Hyperlipidemia, unspecified: Secondary | ICD-10-CM | POA: Diagnosis not present

## 2014-04-01 DIAGNOSIS — E1165 Type 2 diabetes mellitus with hyperglycemia: Secondary | ICD-10-CM | POA: Diagnosis not present

## 2014-04-01 DIAGNOSIS — Z0000 Encounter for general adult medical examination without abnormal findings: Secondary | ICD-10-CM | POA: Diagnosis not present

## 2014-04-01 DIAGNOSIS — Z79899 Other long term (current) drug therapy: Secondary | ICD-10-CM | POA: Diagnosis not present

## 2014-04-01 DIAGNOSIS — E039 Hypothyroidism, unspecified: Secondary | ICD-10-CM | POA: Diagnosis not present

## 2014-04-18 ENCOUNTER — Encounter: Payer: Self-pay | Admitting: *Deleted

## 2014-04-22 DIAGNOSIS — N390 Urinary tract infection, site not specified: Secondary | ICD-10-CM | POA: Diagnosis not present

## 2014-05-06 DIAGNOSIS — S0990XA Unspecified injury of head, initial encounter: Secondary | ICD-10-CM | POA: Diagnosis not present

## 2014-05-06 DIAGNOSIS — R51 Headache: Secondary | ICD-10-CM | POA: Diagnosis not present

## 2014-05-08 DIAGNOSIS — I10 Essential (primary) hypertension: Secondary | ICD-10-CM | POA: Diagnosis not present

## 2014-05-08 DIAGNOSIS — F0390 Unspecified dementia without behavioral disturbance: Secondary | ICD-10-CM | POA: Diagnosis not present

## 2014-05-08 DIAGNOSIS — N390 Urinary tract infection, site not specified: Secondary | ICD-10-CM | POA: Diagnosis not present

## 2014-05-08 DIAGNOSIS — J180 Bronchopneumonia, unspecified organism: Secondary | ICD-10-CM | POA: Diagnosis not present

## 2014-05-16 ENCOUNTER — Ambulatory Visit (INDEPENDENT_AMBULATORY_CARE_PROVIDER_SITE_OTHER): Payer: Medicare Other | Admitting: *Deleted

## 2014-05-16 DIAGNOSIS — I459 Conduction disorder, unspecified: Secondary | ICD-10-CM | POA: Diagnosis not present

## 2014-05-16 NOTE — Progress Notes (Signed)
Normal pacer check. 649 ventricular noise reversions (oreviously noted on checks) No changes made. ROV 10-24-14 @ 1100 with JA/[place].

## 2014-05-21 DIAGNOSIS — I739 Peripheral vascular disease, unspecified: Secondary | ICD-10-CM | POA: Diagnosis not present

## 2014-05-21 DIAGNOSIS — R221 Localized swelling, mass and lump, neck: Secondary | ICD-10-CM | POA: Diagnosis not present

## 2014-05-21 DIAGNOSIS — R0989 Other specified symptoms and signs involving the circulatory and respiratory systems: Secondary | ICD-10-CM | POA: Diagnosis not present

## 2014-05-21 DIAGNOSIS — I10 Essential (primary) hypertension: Secondary | ICD-10-CM | POA: Diagnosis not present

## 2014-05-22 ENCOUNTER — Encounter: Payer: Self-pay | Admitting: Internal Medicine

## 2014-05-23 DIAGNOSIS — J180 Bronchopneumonia, unspecified organism: Secondary | ICD-10-CM | POA: Diagnosis not present

## 2014-05-23 DIAGNOSIS — R05 Cough: Secondary | ICD-10-CM | POA: Diagnosis not present

## 2014-05-23 DIAGNOSIS — J309 Allergic rhinitis, unspecified: Secondary | ICD-10-CM | POA: Diagnosis not present

## 2014-08-29 DIAGNOSIS — M158 Other polyosteoarthritis: Secondary | ICD-10-CM | POA: Diagnosis not present

## 2014-08-29 DIAGNOSIS — F0390 Unspecified dementia without behavioral disturbance: Secondary | ICD-10-CM | POA: Diagnosis not present

## 2014-08-29 DIAGNOSIS — I10 Essential (primary) hypertension: Secondary | ICD-10-CM | POA: Diagnosis not present

## 2014-08-29 DIAGNOSIS — E756 Lipid storage disorder, unspecified: Secondary | ICD-10-CM | POA: Diagnosis not present

## 2014-10-23 DIAGNOSIS — J0100 Acute maxillary sinusitis, unspecified: Secondary | ICD-10-CM | POA: Diagnosis not present

## 2014-10-23 DIAGNOSIS — R51 Headache: Secondary | ICD-10-CM | POA: Diagnosis not present

## 2014-10-23 DIAGNOSIS — I10 Essential (primary) hypertension: Secondary | ICD-10-CM | POA: Diagnosis not present

## 2014-10-23 DIAGNOSIS — J309 Allergic rhinitis, unspecified: Secondary | ICD-10-CM | POA: Diagnosis not present

## 2014-10-24 ENCOUNTER — Encounter: Payer: Self-pay | Admitting: Internal Medicine

## 2014-10-24 ENCOUNTER — Ambulatory Visit (INDEPENDENT_AMBULATORY_CARE_PROVIDER_SITE_OTHER): Payer: Medicare Other | Admitting: Internal Medicine

## 2014-10-24 VITALS — BP 98/70 | HR 50 | Ht 64.0 in | Wt 142.0 lb

## 2014-10-24 DIAGNOSIS — I350 Nonrheumatic aortic (valve) stenosis: Secondary | ICD-10-CM | POA: Diagnosis not present

## 2014-10-24 DIAGNOSIS — Z950 Presence of cardiac pacemaker: Secondary | ICD-10-CM | POA: Diagnosis not present

## 2014-10-24 DIAGNOSIS — I495 Sick sinus syndrome: Secondary | ICD-10-CM | POA: Diagnosis not present

## 2014-10-24 NOTE — Progress Notes (Signed)
PCP: Donzetta Sprung, MD  The patient presents today for routine electrophysiology followup. She is now living in a nursing home.  She has no CV symptoms.  Today, she denies symptoms of  chest pain, shortness of breath, lower extremity edema, dizziness, presyncope, syncope, or neurologic sequela.    Past Medical History  Diagnosis Date  . Aortic valve stenosis   . CAD (coronary artery disease)   . Hypertension   . Unspecified glaucoma   . Bradycardia     s/p PPM  . Hyperlipidemia    Past Surgical History  Procedure Laterality Date  . Pacemaker insertion  1995    single-chamber pacemaker placement  . Abdominal hysterectomy    . Cholecystectomy      Current Outpatient Prescriptions  Medication Sig Dispense Refill  . aspirin 325 MG tablet Take 325 mg by mouth daily.      Marland Kitchen donepezil (ARICEPT) 10 MG tablet Take 10 mg by mouth at bedtime.     . fluticasone (FLONASE) 50 MCG/ACT nasal spray Place 2 sprays into both nostrils daily.     . meloxicam (MOBIC) 15 MG tablet Take 15 mg by mouth daily.     . metoprolol tartrate (LOPRESSOR) 25 MG tablet Take 6.25 mg by mouth 2 (two) times daily. 1/4 tab (6.25 mg twice daily)    . ramipril (ALTACE) 2.5 MG capsule Take 1 tablet by mouth daily.    . simvastatin (ZOCOR) 80 MG tablet Take 40 mg by mouth daily.     . TRAVATAN Z 0.004 % ophthalmic solution Place 1 drop into both eyes At bedtime.     No current facility-administered medications for this visit.    No Known Allergies  History   Social History  . Marital Status: Widowed    Spouse Name: N/A  . Number of Children: N/A  . Years of Education: N/A   Occupational History  . Not on file.   Social History Main Topics  . Smoking status: Never Smoker   . Smokeless tobacco: Never Used  . Alcohol Use: No  . Drug Use: No  . Sexual Activity: Not on file   Other Topics Concern  . Not on file   Social History Narrative   Lives in [place]   Physical Exam: Filed Vitals:   10/24/14 1104  BP: 98/70  Pulse: 50  Height:  (1.626 m)  Weight: 64.411 kg (142 lb)  SpO2: 98%    GEN- The patient is well appearing, alert and oriented x 3 today.   Head- ecchymosis over her forehead s/p fall Eyes-  Sclera clear, conjunctiva pink Ears- hearing intact Oropharynx- clear Neck- supple  Lungs- Clear to ausculation bilaterally, normal work of breathing Chest- pacemaker pocket is well healed Heart- Regular rate and rhythm, 2/6 SEM LUSB (mid peaking) GI- soft, NT, ND, + BS Extremities- no clubbing, cyanosis, or edema  Pacemaker interrogation- reviewed in detail today,  See PACEART report  Assessment and Plan:   1. Symptomatic sinus bradycardia Normal pacemaker function See Pace Art report No changes today  2. CAD/ aortic stenosis Stable I discussed exam findings of AS with daughter last visit.  We have decided that conservative measures are appropriate.   No change required today  3. HTN Stable No change required today  Return to the device clinic in 1 year Merlin every 3 months

## 2014-10-24 NOTE — Patient Instructions (Addendum)
Your physician recommends that you continue on your current medications as directed. Please refer to the Current Medication list given to you today. Merlin device check on 01/26/15. Your physician recommends that you schedule a follow-up appointment in: 1 year with Dr. Allred. You will receive a reminder letter in the mail in about 10 months reminding you to call and schedule your appointment. If you don't receive this letter, please contact our office. 

## 2014-10-27 LAB — CUP PACEART INCLINIC DEVICE CHECK
Brady Statistic RV Percent Paced: 27 %
Lead Channel Impedance Value: 450 Ohm
Lead Channel Pacing Threshold Amplitude: 0.5 V
Lead Channel Sensing Intrinsic Amplitude: 11.3 mV
Lead Channel Setting Pacing Amplitude: 2.5 V
Lead Channel Setting Pacing Pulse Width: 0.4 ms
Lead Channel Setting Sensing Sensitivity: 2 mV
MDC IDC MSMT BATTERY REMAINING LONGEVITY: 151.2 mo
MDC IDC MSMT BATTERY VOLTAGE: 2.96 V
MDC IDC MSMT LEADCHNL RV PACING THRESHOLD AMPLITUDE: 0.5 V
MDC IDC MSMT LEADCHNL RV PACING THRESHOLD PULSEWIDTH: 0.4 ms
MDC IDC MSMT LEADCHNL RV PACING THRESHOLD PULSEWIDTH: 0.4 ms
MDC IDC SESS DTM: 20160805152710
Pulse Gen Model: 1110
Pulse Gen Serial Number: 2259345

## 2014-10-31 DIAGNOSIS — Z743 Need for continuous supervision: Secondary | ICD-10-CM | POA: Diagnosis not present

## 2014-10-31 DIAGNOSIS — Z7982 Long term (current) use of aspirin: Secondary | ICD-10-CM | POA: Diagnosis not present

## 2014-10-31 DIAGNOSIS — S0590XA Unspecified injury of unspecified eye and orbit, initial encounter: Secondary | ICD-10-CM | POA: Diagnosis not present

## 2014-10-31 DIAGNOSIS — S0181XA Laceration without foreign body of other part of head, initial encounter: Secondary | ICD-10-CM | POA: Diagnosis not present

## 2014-10-31 DIAGNOSIS — H5713 Ocular pain, bilateral: Secondary | ICD-10-CM | POA: Diagnosis not present

## 2014-10-31 DIAGNOSIS — R279 Unspecified lack of coordination: Secondary | ICD-10-CM | POA: Diagnosis not present

## 2014-10-31 DIAGNOSIS — Z950 Presence of cardiac pacemaker: Secondary | ICD-10-CM | POA: Diagnosis not present

## 2014-10-31 DIAGNOSIS — E038 Other specified hypothyroidism: Secondary | ICD-10-CM | POA: Diagnosis not present

## 2014-10-31 DIAGNOSIS — E1165 Type 2 diabetes mellitus with hyperglycemia: Secondary | ICD-10-CM | POA: Diagnosis not present

## 2014-10-31 DIAGNOSIS — I10 Essential (primary) hypertension: Secondary | ICD-10-CM | POA: Diagnosis not present

## 2014-10-31 DIAGNOSIS — E784 Other hyperlipidemia: Secondary | ICD-10-CM | POA: Diagnosis not present

## 2014-10-31 DIAGNOSIS — R51 Headache: Secondary | ICD-10-CM | POA: Diagnosis not present

## 2014-10-31 DIAGNOSIS — W19XXXA Unspecified fall, initial encounter: Secondary | ICD-10-CM | POA: Diagnosis not present

## 2014-10-31 DIAGNOSIS — Z79899 Other long term (current) drug therapy: Secondary | ICD-10-CM | POA: Diagnosis not present

## 2014-11-03 DIAGNOSIS — D649 Anemia, unspecified: Secondary | ICD-10-CM | POA: Diagnosis not present

## 2014-11-03 DIAGNOSIS — E119 Type 2 diabetes mellitus without complications: Secondary | ICD-10-CM | POA: Diagnosis not present

## 2014-11-03 DIAGNOSIS — I10 Essential (primary) hypertension: Secondary | ICD-10-CM | POA: Diagnosis not present

## 2014-11-03 DIAGNOSIS — Z79899 Other long term (current) drug therapy: Secondary | ICD-10-CM | POA: Diagnosis not present

## 2014-11-07 DIAGNOSIS — S0182XA Laceration with foreign body of other part of head, initial encounter: Secondary | ICD-10-CM | POA: Diagnosis not present

## 2014-11-07 DIAGNOSIS — I10 Essential (primary) hypertension: Secondary | ICD-10-CM | POA: Diagnosis not present

## 2015-01-26 ENCOUNTER — Telehealth: Payer: Self-pay | Admitting: Cardiology

## 2015-01-26 ENCOUNTER — Encounter: Payer: Medicare Other | Admitting: *Deleted

## 2015-01-26 NOTE — Telephone Encounter (Signed)
Attempted to confirm remote transmission with pt. No answer and was unable to leave a message.   

## 2015-01-27 ENCOUNTER — Encounter: Payer: Self-pay | Admitting: Cardiology

## 2015-03-05 DIAGNOSIS — F0390 Unspecified dementia without behavioral disturbance: Secondary | ICD-10-CM | POA: Diagnosis not present

## 2015-03-05 DIAGNOSIS — I10 Essential (primary) hypertension: Secondary | ICD-10-CM | POA: Diagnosis not present

## 2015-03-05 DIAGNOSIS — M153 Secondary multiple arthritis: Secondary | ICD-10-CM | POA: Diagnosis not present

## 2015-03-05 DIAGNOSIS — J3089 Other allergic rhinitis: Secondary | ICD-10-CM | POA: Diagnosis not present

## 2015-03-12 DIAGNOSIS — E559 Vitamin D deficiency, unspecified: Secondary | ICD-10-CM | POA: Diagnosis not present

## 2015-03-12 DIAGNOSIS — F0390 Unspecified dementia without behavioral disturbance: Secondary | ICD-10-CM | POA: Diagnosis not present

## 2015-03-12 DIAGNOSIS — E038 Other specified hypothyroidism: Secondary | ICD-10-CM | POA: Diagnosis not present

## 2015-03-12 DIAGNOSIS — Z79899 Other long term (current) drug therapy: Secondary | ICD-10-CM | POA: Diagnosis not present

## 2015-03-12 DIAGNOSIS — M158 Other polyosteoarthritis: Secondary | ICD-10-CM | POA: Diagnosis not present

## 2015-03-12 DIAGNOSIS — I10 Essential (primary) hypertension: Secondary | ICD-10-CM | POA: Diagnosis not present

## 2015-03-12 DIAGNOSIS — E756 Lipid storage disorder, unspecified: Secondary | ICD-10-CM | POA: Diagnosis not present

## 2015-03-12 DIAGNOSIS — E1165 Type 2 diabetes mellitus with hyperglycemia: Secondary | ICD-10-CM | POA: Diagnosis not present

## 2015-03-12 DIAGNOSIS — R221 Localized swelling, mass and lump, neck: Secondary | ICD-10-CM | POA: Diagnosis not present

## 2015-03-12 DIAGNOSIS — N390 Urinary tract infection, site not specified: Secondary | ICD-10-CM | POA: Diagnosis not present

## 2015-03-12 DIAGNOSIS — D518 Other vitamin B12 deficiency anemias: Secondary | ICD-10-CM | POA: Diagnosis not present

## 2015-03-12 DIAGNOSIS — E782 Mixed hyperlipidemia: Secondary | ICD-10-CM | POA: Diagnosis not present

## 2015-03-12 DIAGNOSIS — R0989 Other specified symptoms and signs involving the circulatory and respiratory systems: Secondary | ICD-10-CM | POA: Diagnosis not present

## 2015-04-18 DIAGNOSIS — I10 Essential (primary) hypertension: Secondary | ICD-10-CM | POA: Diagnosis not present

## 2015-04-18 DIAGNOSIS — Z79899 Other long term (current) drug therapy: Secondary | ICD-10-CM | POA: Diagnosis not present

## 2015-04-18 DIAGNOSIS — S0011XA Contusion of right eyelid and periocular area, initial encounter: Secondary | ICD-10-CM | POA: Diagnosis not present

## 2015-04-18 DIAGNOSIS — S0010XA Contusion of unspecified eyelid and periocular area, initial encounter: Secondary | ICD-10-CM | POA: Diagnosis not present

## 2015-04-18 DIAGNOSIS — I252 Old myocardial infarction: Secondary | ICD-10-CM | POA: Diagnosis not present

## 2015-04-18 DIAGNOSIS — W01198A Fall on same level from slipping, tripping and stumbling with subsequent striking against other object, initial encounter: Secondary | ICD-10-CM | POA: Diagnosis not present

## 2015-04-18 DIAGNOSIS — S0511XA Contusion of eyeball and orbital tissues, right eye, initial encounter: Secondary | ICD-10-CM | POA: Diagnosis not present

## 2015-04-18 DIAGNOSIS — S0190XA Unspecified open wound of unspecified part of head, initial encounter: Secondary | ICD-10-CM | POA: Diagnosis not present

## 2015-04-18 DIAGNOSIS — I2510 Atherosclerotic heart disease of native coronary artery without angina pectoris: Secondary | ICD-10-CM | POA: Diagnosis not present

## 2015-04-18 DIAGNOSIS — Z7982 Long term (current) use of aspirin: Secondary | ICD-10-CM | POA: Diagnosis not present

## 2015-04-18 DIAGNOSIS — S199XXA Unspecified injury of neck, initial encounter: Secondary | ICD-10-CM | POA: Diagnosis not present

## 2015-04-18 DIAGNOSIS — R229 Localized swelling, mass and lump, unspecified: Secondary | ICD-10-CM | POA: Diagnosis not present

## 2015-04-18 DIAGNOSIS — S01111A Laceration without foreign body of right eyelid and periocular area, initial encounter: Secondary | ICD-10-CM | POA: Diagnosis not present

## 2015-04-18 DIAGNOSIS — S0990XA Unspecified injury of head, initial encounter: Secondary | ICD-10-CM | POA: Diagnosis not present

## 2015-04-18 DIAGNOSIS — R40241 Glasgow coma scale score 13-15, unspecified time: Secondary | ICD-10-CM | POA: Diagnosis not present

## 2015-04-18 DIAGNOSIS — S61511A Laceration without foreign body of right wrist, initial encounter: Secondary | ICD-10-CM | POA: Diagnosis not present

## 2015-04-18 DIAGNOSIS — R220 Localized swelling, mass and lump, head: Secondary | ICD-10-CM | POA: Diagnosis not present

## 2015-04-19 DIAGNOSIS — R220 Localized swelling, mass and lump, head: Secondary | ICD-10-CM | POA: Diagnosis not present

## 2015-04-19 DIAGNOSIS — S199XXA Unspecified injury of neck, initial encounter: Secondary | ICD-10-CM | POA: Diagnosis not present

## 2015-04-19 DIAGNOSIS — S0511XA Contusion of eyeball and orbital tissues, right eye, initial encounter: Secondary | ICD-10-CM | POA: Diagnosis not present

## 2015-04-19 DIAGNOSIS — S0990XA Unspecified injury of head, initial encounter: Secondary | ICD-10-CM | POA: Diagnosis not present

## 2015-04-21 DIAGNOSIS — N390 Urinary tract infection, site not specified: Secondary | ICD-10-CM | POA: Diagnosis not present

## 2015-04-21 DIAGNOSIS — R42 Dizziness and giddiness: Secondary | ICD-10-CM | POA: Diagnosis not present

## 2015-04-21 DIAGNOSIS — N3080 Other cystitis without hematuria: Secondary | ICD-10-CM | POA: Diagnosis not present

## 2015-04-21 DIAGNOSIS — Z7401 Bed confinement status: Secondary | ICD-10-CM | POA: Diagnosis not present

## 2015-04-21 DIAGNOSIS — I2510 Atherosclerotic heart disease of native coronary artery without angina pectoris: Secondary | ICD-10-CM | POA: Diagnosis not present

## 2015-04-21 DIAGNOSIS — R4182 Altered mental status, unspecified: Secondary | ICD-10-CM | POA: Diagnosis not present

## 2015-04-21 DIAGNOSIS — S0990XA Unspecified injury of head, initial encounter: Secondary | ICD-10-CM | POA: Diagnosis not present

## 2015-04-21 DIAGNOSIS — H409 Unspecified glaucoma: Secondary | ICD-10-CM | POA: Diagnosis not present

## 2015-04-21 DIAGNOSIS — I252 Old myocardial infarction: Secondary | ICD-10-CM | POA: Diagnosis not present

## 2015-04-21 DIAGNOSIS — N289 Disorder of kidney and ureter, unspecified: Secondary | ICD-10-CM | POA: Diagnosis not present

## 2015-04-21 DIAGNOSIS — R402411 Glasgow coma scale score 13-15, in the field [EMT or ambulance]: Secondary | ICD-10-CM | POA: Diagnosis not present

## 2015-04-21 DIAGNOSIS — E860 Dehydration: Secondary | ICD-10-CM | POA: Diagnosis not present

## 2015-04-21 DIAGNOSIS — F4489 Other dissociative and conversion disorders: Secondary | ICD-10-CM | POA: Diagnosis not present

## 2015-04-21 DIAGNOSIS — Z950 Presence of cardiac pacemaker: Secondary | ICD-10-CM | POA: Diagnosis not present

## 2015-04-21 DIAGNOSIS — N320 Bladder-neck obstruction: Secondary | ICD-10-CM | POA: Diagnosis not present

## 2015-04-21 DIAGNOSIS — S098XXA Other specified injuries of head, initial encounter: Secondary | ICD-10-CM | POA: Diagnosis not present

## 2015-04-21 DIAGNOSIS — I10 Essential (primary) hypertension: Secondary | ICD-10-CM | POA: Diagnosis not present

## 2015-04-21 DIAGNOSIS — N3289 Other specified disorders of bladder: Secondary | ICD-10-CM | POA: Diagnosis not present

## 2015-05-14 DIAGNOSIS — N390 Urinary tract infection, site not specified: Secondary | ICD-10-CM | POA: Diagnosis not present

## 2015-05-15 DIAGNOSIS — R05 Cough: Secondary | ICD-10-CM | POA: Diagnosis not present

## 2015-05-18 DIAGNOSIS — M6281 Muscle weakness (generalized): Secondary | ICD-10-CM | POA: Diagnosis not present

## 2015-05-18 DIAGNOSIS — N341 Nonspecific urethritis: Secondary | ICD-10-CM | POA: Diagnosis not present

## 2015-05-18 DIAGNOSIS — S01111D Laceration without foreign body of right eyelid and periocular area, subsequent encounter: Secondary | ICD-10-CM | POA: Diagnosis not present

## 2015-05-18 DIAGNOSIS — M5136 Other intervertebral disc degeneration, lumbar region: Secondary | ICD-10-CM | POA: Diagnosis not present

## 2015-05-21 DIAGNOSIS — I10 Essential (primary) hypertension: Secondary | ICD-10-CM | POA: Diagnosis not present

## 2015-05-21 DIAGNOSIS — N390 Urinary tract infection, site not specified: Secondary | ICD-10-CM | POA: Diagnosis not present

## 2015-05-21 DIAGNOSIS — J208 Acute bronchitis due to other specified organisms: Secondary | ICD-10-CM | POA: Diagnosis not present

## 2015-05-21 DIAGNOSIS — R112 Nausea with vomiting, unspecified: Secondary | ICD-10-CM | POA: Diagnosis not present

## 2015-05-25 DIAGNOSIS — J188 Other pneumonia, unspecified organism: Secondary | ICD-10-CM | POA: Diagnosis not present

## 2015-05-25 DIAGNOSIS — E7800 Pure hypercholesterolemia, unspecified: Secondary | ICD-10-CM | POA: Diagnosis not present

## 2015-05-25 DIAGNOSIS — R05 Cough: Secondary | ICD-10-CM | POA: Diagnosis not present

## 2015-05-25 DIAGNOSIS — R739 Hyperglycemia, unspecified: Secondary | ICD-10-CM | POA: Diagnosis not present

## 2015-05-25 DIAGNOSIS — J181 Lobar pneumonia, unspecified organism: Secondary | ICD-10-CM | POA: Diagnosis not present

## 2015-05-25 DIAGNOSIS — Z9981 Dependence on supplemental oxygen: Secondary | ICD-10-CM | POA: Diagnosis not present

## 2015-05-25 DIAGNOSIS — Z950 Presence of cardiac pacemaker: Secondary | ICD-10-CM | POA: Diagnosis not present

## 2015-05-25 DIAGNOSIS — E876 Hypokalemia: Secondary | ICD-10-CM | POA: Diagnosis not present

## 2015-05-25 DIAGNOSIS — J989 Respiratory disorder, unspecified: Secondary | ICD-10-CM | POA: Diagnosis not present

## 2015-05-25 DIAGNOSIS — E860 Dehydration: Secondary | ICD-10-CM | POA: Diagnosis not present

## 2015-05-25 DIAGNOSIS — J90 Pleural effusion, not elsewhere classified: Secondary | ICD-10-CM | POA: Diagnosis not present

## 2015-05-25 DIAGNOSIS — R404 Transient alteration of awareness: Secondary | ICD-10-CM | POA: Diagnosis not present

## 2015-05-25 DIAGNOSIS — R531 Weakness: Secondary | ICD-10-CM | POA: Diagnosis not present

## 2015-05-25 DIAGNOSIS — I080 Rheumatic disorders of both mitral and aortic valves: Secondary | ICD-10-CM | POA: Diagnosis not present

## 2015-05-25 DIAGNOSIS — I10 Essential (primary) hypertension: Secondary | ICD-10-CM | POA: Diagnosis not present

## 2015-05-25 DIAGNOSIS — Z8673 Personal history of transient ischemic attack (TIA), and cerebral infarction without residual deficits: Secondary | ICD-10-CM | POA: Diagnosis not present

## 2015-05-25 DIAGNOSIS — G9389 Other specified disorders of brain: Secondary | ICD-10-CM | POA: Diagnosis not present

## 2015-05-25 DIAGNOSIS — I252 Old myocardial infarction: Secondary | ICD-10-CM | POA: Diagnosis not present

## 2015-05-25 DIAGNOSIS — Z79899 Other long term (current) drug therapy: Secondary | ICD-10-CM | POA: Diagnosis not present

## 2015-05-25 DIAGNOSIS — R402431 Glasgow coma scale score 3-8, in the field [EMT or ambulance]: Secondary | ICD-10-CM | POA: Diagnosis not present

## 2015-05-25 DIAGNOSIS — H409 Unspecified glaucoma: Secondary | ICD-10-CM | POA: Diagnosis not present

## 2015-05-25 DIAGNOSIS — Z7401 Bed confinement status: Secondary | ICD-10-CM | POA: Diagnosis not present

## 2015-05-25 DIAGNOSIS — J189 Pneumonia, unspecified organism: Secondary | ICD-10-CM | POA: Diagnosis not present

## 2015-05-25 DIAGNOSIS — I2510 Atherosclerotic heart disease of native coronary artery without angina pectoris: Secondary | ICD-10-CM | POA: Diagnosis not present

## 2015-06-02 DIAGNOSIS — I5189 Other ill-defined heart diseases: Secondary | ICD-10-CM | POA: Diagnosis not present

## 2015-06-02 DIAGNOSIS — G309 Alzheimer's disease, unspecified: Secondary | ICD-10-CM | POA: Diagnosis not present

## 2015-06-02 DIAGNOSIS — I350 Nonrheumatic aortic (valve) stenosis: Secondary | ICD-10-CM | POA: Diagnosis not present

## 2015-06-04 DIAGNOSIS — J159 Unspecified bacterial pneumonia: Secondary | ICD-10-CM | POA: Diagnosis not present

## 2015-06-04 DIAGNOSIS — I10 Essential (primary) hypertension: Secondary | ICD-10-CM | POA: Diagnosis not present

## 2015-06-04 DIAGNOSIS — G309 Alzheimer's disease, unspecified: Secondary | ICD-10-CM | POA: Diagnosis not present

## 2015-06-04 DIAGNOSIS — N390 Urinary tract infection, site not specified: Secondary | ICD-10-CM | POA: Diagnosis not present

## 2015-06-20 DEATH — deceased
